# Patient Record
Sex: Female | Born: 1985 | Race: Black or African American | Hispanic: No | Marital: Single | Smoking: Former smoker
Health system: Southern US, Community
[De-identification: ages and names within clinical notes are randomized; demographics above are authoritative.]

## PROBLEM LIST (undated history)

## (undated) ENCOUNTER — Inpatient Hospital Stay (HOSPITAL_COMMUNITY): Payer: Self-pay

## (undated) DIAGNOSIS — G43909 Migraine, unspecified, not intractable, without status migrainosus: Secondary | ICD-10-CM

## (undated) DIAGNOSIS — R87629 Unspecified abnormal cytological findings in specimens from vagina: Secondary | ICD-10-CM

---
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
## 2007-09-18 ENCOUNTER — Ambulatory Visit (HOSPITAL_COMMUNITY): Admission: AD | Admit: 2007-09-18 | Discharge: 2007-09-18 | Payer: Self-pay | Admitting: Obstetrics and Gynecology

## 2007-09-18 ENCOUNTER — Encounter (INDEPENDENT_AMBULATORY_CARE_PROVIDER_SITE_OTHER): Payer: Self-pay | Admitting: Obstetrics and Gynecology

## 2009-07-01 ENCOUNTER — Emergency Department (HOSPITAL_COMMUNITY): Admission: EM | Admit: 2009-07-01 | Discharge: 2009-07-01 | Payer: Self-pay | Admitting: Emergency Medicine

## 2010-04-18 ENCOUNTER — Inpatient Hospital Stay (HOSPITAL_COMMUNITY): Admission: AD | Admit: 2010-04-18 | Discharge: 2010-04-18 | Payer: Self-pay | Admitting: Obstetrics and Gynecology

## 2010-04-18 ENCOUNTER — Ambulatory Visit: Payer: Self-pay | Admitting: Gynecology

## 2010-05-09 ENCOUNTER — Inpatient Hospital Stay (HOSPITAL_COMMUNITY): Admission: AD | Admit: 2010-05-09 | Discharge: 2010-05-09 | Payer: Self-pay | Admitting: Obstetrics and Gynecology

## 2010-05-12 ENCOUNTER — Inpatient Hospital Stay (HOSPITAL_COMMUNITY): Admission: AD | Admit: 2010-05-12 | Discharge: 2010-05-15 | Payer: Self-pay | Admitting: Obstetrics and Gynecology

## 2010-08-04 ENCOUNTER — Emergency Department (HOSPITAL_BASED_OUTPATIENT_CLINIC_OR_DEPARTMENT_OTHER)
Admission: EM | Admit: 2010-08-04 | Discharge: 2010-08-04 | Disposition: A | Discharge status: Home / Self Care | Payer: 59 | Attending: Emergency Medicine | Admitting: Emergency Medicine

## 2010-08-04 DIAGNOSIS — F17200 Nicotine dependence, unspecified, uncomplicated: Secondary | ICD-10-CM | POA: Insufficient documentation

## 2010-08-04 DIAGNOSIS — R109 Unspecified abdominal pain: Secondary | ICD-10-CM | POA: Insufficient documentation

## 2010-08-04 DIAGNOSIS — N390 Urinary tract infection, site not specified: Secondary | ICD-10-CM | POA: Insufficient documentation

## 2010-08-04 LAB — URINALYSIS, ROUTINE W REFLEX MICROSCOPIC
Bilirubin Urine: NEGATIVE
Nitrite: NEGATIVE
Specific Gravity, Urine: 1.011 (ref 1.005–1.030)
pH: 6.5 (ref 5.0–8.0)

## 2010-08-04 LAB — URINE MICROSCOPIC-ADD ON

## 2010-08-05 ENCOUNTER — Emergency Department (HOSPITAL_BASED_OUTPATIENT_CLINIC_OR_DEPARTMENT_OTHER)
Admission: EM | Admit: 2010-08-05 | Discharge: 2010-08-05 | Disposition: A | Discharge status: Home / Self Care | Payer: 59 | Attending: Emergency Medicine | Admitting: Emergency Medicine

## 2010-08-05 ENCOUNTER — Emergency Department (INDEPENDENT_AMBULATORY_CARE_PROVIDER_SITE_OTHER): Payer: 59

## 2010-08-05 DIAGNOSIS — B9689 Other specified bacterial agents as the cause of diseases classified elsewhere: Secondary | ICD-10-CM | POA: Insufficient documentation

## 2010-08-05 DIAGNOSIS — N949 Unspecified condition associated with female genital organs and menstrual cycle: Secondary | ICD-10-CM

## 2010-08-05 DIAGNOSIS — N12 Tubulo-interstitial nephritis, not specified as acute or chronic: Secondary | ICD-10-CM | POA: Insufficient documentation

## 2010-08-05 DIAGNOSIS — A499 Bacterial infection, unspecified: Secondary | ICD-10-CM | POA: Insufficient documentation

## 2010-08-05 DIAGNOSIS — N760 Acute vaginitis: Secondary | ICD-10-CM | POA: Insufficient documentation

## 2010-08-05 LAB — COMPREHENSIVE METABOLIC PANEL
Albumin: 4.4 g/dL (ref 3.5–5.2)
Alkaline Phosphatase: 102 U/L (ref 39–117)
BUN: 7 mg/dL (ref 6–23)
Calcium: 9.3 mg/dL (ref 8.4–10.5)
Potassium: 3.6 mEq/L (ref 3.5–5.1)
Total Protein: 8.1 g/dL (ref 6.0–8.3)

## 2010-08-05 LAB — URINALYSIS, ROUTINE W REFLEX MICROSCOPIC
Ketones, ur: 40 mg/dL — AB
Urine Glucose, Fasting: NEGATIVE mg/dL
pH: 5 (ref 5.0–8.0)

## 2010-08-05 LAB — URINE MICROSCOPIC-ADD ON

## 2010-08-05 LAB — WET PREP, GENITAL
Trich, Wet Prep: NONE SEEN
Yeast Wet Prep HPF POC: NONE SEEN

## 2010-08-05 LAB — URINE CULTURE: Culture  Setup Time: 201202080536

## 2010-08-05 LAB — CBC
MCHC: 34.3 g/dL (ref 30.0–36.0)
RDW: 12.8 % (ref 11.5–15.5)

## 2010-08-05 LAB — DIFFERENTIAL
Basophils Absolute: 0 10*3/uL (ref 0.0–0.1)
Basophils Relative: 0 % (ref 0–1)
Eosinophils Absolute: 0 10*3/uL (ref 0.0–0.7)
Eosinophils Relative: 0 % (ref 0–5)
Monocytes Absolute: 1 10*3/uL (ref 0.1–1.0)

## 2010-08-06 LAB — GC/CHLAMYDIA PROBE AMP, GENITAL
Chlamydia, DNA Probe: NEGATIVE
GC Probe Amp, Genital: NEGATIVE

## 2010-08-17 ENCOUNTER — Other Ambulatory Visit: Payer: Self-pay | Admitting: Obstetrics and Gynecology

## 2010-09-07 LAB — CBC
HCT: 30.7 % — ABNORMAL LOW (ref 36.0–46.0)
Hemoglobin: 10.3 g/dL — ABNORMAL LOW (ref 12.0–15.0)
Hemoglobin: 10.6 g/dL — ABNORMAL LOW (ref 12.0–15.0)
RBC: 3.3 MIL/uL — ABNORMAL LOW (ref 3.87–5.11)
RBC: 3.4 MIL/uL — ABNORMAL LOW (ref 3.87–5.11)
WBC: 11.1 10*3/uL — ABNORMAL HIGH (ref 4.0–10.5)
WBC: 15.6 10*3/uL — ABNORMAL HIGH (ref 4.0–10.5)

## 2010-09-07 LAB — RPR: RPR Ser Ql: NONREACTIVE

## 2010-09-08 LAB — URINE MICROSCOPIC-ADD ON

## 2010-09-08 LAB — URINE CULTURE
Colony Count: NO GROWTH
Culture  Setup Time: 201110231846
Culture: NO GROWTH

## 2010-09-08 LAB — URINALYSIS, ROUTINE W REFLEX MICROSCOPIC
Glucose, UA: NEGATIVE mg/dL
Specific Gravity, Urine: 1.025 (ref 1.005–1.030)

## 2010-09-12 LAB — BASIC METABOLIC PANEL
BUN: 11 mg/dL (ref 6–23)
Chloride: 105 mEq/L (ref 96–112)
Creatinine, Ser: 0.88 mg/dL (ref 0.4–1.2)
GFR calc Af Amer: >60 mL/min (ref >60)
GFR calc non Af Amer: >60 mL/min (ref >60)

## 2010-09-12 LAB — CBC
MCV: 89.9 fL (ref 78.0–100.0)
Platelets: 302 10*3/uL (ref 150–400)
RBC: 3.85 MIL/uL — ABNORMAL LOW (ref 3.87–5.11)
WBC: 7.4 10*3/uL (ref 4.0–10.5)

## 2010-09-12 LAB — DIFFERENTIAL
Eosinophils Absolute: 0.2 10*3/uL (ref 0.0–0.7)
Lymphocytes Relative: 29 % (ref 12–46)
Lymphs Abs: 2.2 10*3/uL (ref 0.7–4.0)
Monocytes Relative: 6 % (ref 3–12)
Neutro Abs: 4.6 10*3/uL (ref 1.7–7.7)
Neutrophils Relative %: 62 % (ref 43–77)
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
## 2010-11-09 NOTE — Op Note (Signed)
NAMEMONTEZ, STRYKER           ACCOUNT NO.:  [id number]   MEDICAL RECORD NO.:  [id number]          PATIENT TYPE:  AMB   LOCATION:                                [facility]ILITY:  [facility]   PHYSICIAN:  Michelle L. Grewal, M.D.DATE OF BIRTH:  1986/05/04   DATE OF PROCEDURE:  09/18/2007  DATE OF DISCHARGE:                               OPERATIVE REPORT   PREOPERATIVE DIAGNOSIS:  Incomplete abortion.   POSTOPERATIVE DIAGNOSIS:  Incomplete abortion.   PROCEDURE:  D&E.   SURGEON:  Dr. Vincente Poli.   ANESTHESIA:  MAC with local.   SPECIMENS:  Products of conception.   ESTIMATED BLOOD LOSS:  Was minimal.   PROCEDURE:  Patient was taken to the operating room.  Her local was  infiltrated without difficulty after she was given IV sedation and  placed in the lithotomy position.  She was prepped and draped.  She was  noted to have a vagina full of clots.  The speculum was inserted into  the vagina.  The cervix was grasped with the tenaculum.  The  paracervical block was performed in standard fashion.  The cervix was  already dilated.  I did dilate a little bit more using Pratt dilators  without difficulty.  A #7 suction cannula was inserted, and the uterus  was thoroughly suctioned of all tissue.  It was consistent with products  of conception.  The suction cannula was removed, and sharp curette was  inserted.  The uterus was thoroughly curetted of all tissue.  Polyp  forceps were inserted, and the remainder of tissue was removed.  At the  end of the procedure, the uterine cavity was completely clean.  She was  given some Methergine because she had so much bleeding prior to going to  the operating room.  The patient went to recovery room in stable  condition.  All sponge, lap, and instrument counts were correct x2.  She  will be given RhoGAM if she is Rh negative.  She will also be sent home  with the Methergine and Vicodin to use.  She will be given the usual  postoperative precautions.  She will  follow up in Dr. Dennie Bible office  for postoperative check in two weeks.      Michelle L. Vincente Poli, M.D.  Electronically Signed     MLG/MEDQ  D:  09/18/2007  T:  09/19/2007  Job:  [id number]

## 2010-11-12 NOTE — H&P (Signed)
[name], [name] NO.:  [id number]   MEDICAL RECORD NO.:  [id number]           PATIENT TYPE:   LOCATION:  SDC                           FACILITY:  [facility]   PHYSICIAN:  Duke Salvia. Marcelle Overlie, M.D.DATE OF BIRTH:  Jan 01, 1986   DATE OF ADMISSION:  09/20/2007  DATE OF DISCHARGE:                              HISTORY & PHYSICAL   CHIEF COMPLAINT:  Missed AB.   HISTORY OF PRESENT ILLNESS:  A 25 year old, G2, P0-0-1-0 was seen in the  office for A new OB appointment, unable to hear FHR. Ultrasound was done  showing a 7-week 6-day IUP, no fetal heart activity noted. Presents now  for D&E this procedure including risks related to bleeding, infection,  other complications that may require additional surgery all reviewed  with her.  Her blood type is O+.   PAST MEDICAL HISTORY:   ALLERGIES:  None.   OPERATIONS:  None.   FAMILY HISTORY:  Significant for a father with hypertension, otherwise  unremarkable.   PHYSICAL EXAM:  VITAL SIGNS:  Temperature 98.2, blood pressure 120/78.  HEENT:  Unremarkable.  NECK:  Supple without masses.  LUNGS:  Clear.  CARDIOVASCULAR:  Regular rate and rhythm without murmurs, rubs, gallops.  BREASTS:  Without masses.  ABDOMEN:  Soft, flat, nontender.  PELVIC:  Normal external genitalia.  Vagina and cervix clear. The Uterus  is 7-8 weeks size, mid position.  Adnexa negative.  EXTREMITIES/NEUROLOGIC:  Unremarkable.   IMPRESSION:  7-8 week intrauterine pregnancy, missed abortion.   PLAN:  D&E procedure and risks reviewed as above up.      Richard M. Marcelle Overlie, M.D.  Electronically Signed     RMH/MEDQ  D:  09/20/2007  T:  09/20/2007  Job:  [id number]

## 2010-11-24 ENCOUNTER — Emergency Department (INDEPENDENT_AMBULATORY_CARE_PROVIDER_SITE_OTHER): Payer: No Typology Code available for payment source

## 2010-11-24 ENCOUNTER — Emergency Department (HOSPITAL_BASED_OUTPATIENT_CLINIC_OR_DEPARTMENT_OTHER)
Admission: EM | Admit: 2010-11-24 | Discharge: 2010-11-25 | Disposition: A | Discharge status: Home / Self Care | Payer: No Typology Code available for payment source | Attending: Emergency Medicine | Admitting: Emergency Medicine

## 2010-11-24 DIAGNOSIS — Y92410 Unspecified street and highway as the place of occurrence of the external cause: Secondary | ICD-10-CM | POA: Insufficient documentation

## 2010-11-24 DIAGNOSIS — F17200 Nicotine dependence, unspecified, uncomplicated: Secondary | ICD-10-CM | POA: Insufficient documentation

## 2010-11-24 DIAGNOSIS — S139XXA Sprain of joints and ligaments of unspecified parts of neck, initial encounter: Secondary | ICD-10-CM | POA: Insufficient documentation

## 2010-11-24 DIAGNOSIS — M542 Cervicalgia: Secondary | ICD-10-CM

## 2011-01-01 ENCOUNTER — Emergency Department (INDEPENDENT_AMBULATORY_CARE_PROVIDER_SITE_OTHER): Payer: 59

## 2011-01-01 ENCOUNTER — Encounter: Payer: Self-pay | Admitting: *Deleted

## 2011-01-01 ENCOUNTER — Emergency Department (HOSPITAL_BASED_OUTPATIENT_CLINIC_OR_DEPARTMENT_OTHER)
Admission: EM | Admit: 2011-01-01 | Discharge: 2011-01-01 | Disposition: A | Discharge status: Home / Self Care | Payer: 59 | Attending: Emergency Medicine | Admitting: Emergency Medicine

## 2011-01-01 DIAGNOSIS — R05 Cough: Secondary | ICD-10-CM | POA: Insufficient documentation

## 2011-01-01 DIAGNOSIS — J019 Acute sinusitis, unspecified: Secondary | ICD-10-CM

## 2011-01-01 DIAGNOSIS — R070 Pain in throat: Secondary | ICD-10-CM

## 2011-01-01 DIAGNOSIS — R509 Fever, unspecified: Secondary | ICD-10-CM

## 2011-01-01 DIAGNOSIS — R059 Cough, unspecified: Secondary | ICD-10-CM

## 2011-01-01 DIAGNOSIS — R0989 Other specified symptoms and signs involving the circulatory and respiratory systems: Secondary | ICD-10-CM

## 2011-01-01 MED ORDER — IBUPROFEN 400 MG PO TABS
ORAL_TABLET | ORAL | Status: AC
  Filled 2011-01-01: qty 1

## 2011-01-01 MED ORDER — IBUPROFEN 400 MG PO TABS: 400.0000 mg | ORAL_TABLET | Freq: Once | ORAL | Status: DC

## 2011-01-01 MED ORDER — IBUPROFEN 200 MG PO TABS
ORAL_TABLET | ORAL | Status: AC
  Filled 2011-01-01: qty 1

## 2011-01-01 MED ORDER — IBUPROFEN 200 MG PO TABS: 200.0000 mg | ORAL_TABLET | Freq: Once | ORAL | Status: DC

## 2011-01-01 MED ORDER — HYDROCOD POLST-CHLORPHEN POLST 10-8 MG/5ML PO LQCR: 5.0000 mL | Freq: Two times a day (BID) | ORAL | Status: DC

## 2011-01-01 MED ORDER — ACETAMINOPHEN 325 MG PO TABS
650.0000 mg | ORAL_TABLET | Freq: Once | ORAL | Status: AC
  Administered 2011-01-01: 650 mg via ORAL

## 2011-01-01 MED ORDER — HYDROCOD POLST-CHLORPHEN POLST 10-8 MG/5ML PO LQCR
ORAL | Status: AC
  Filled 2011-01-01: qty 5

## 2011-01-01 MED ORDER — ACETAMINOPHEN 325 MG PO TABS
ORAL_TABLET | ORAL | Status: AC
  Filled 2011-01-01: qty 2

## 2011-01-01 MED ORDER — AZITHROMYCIN 250 MG PO TABS: ORAL_TABLET | ORAL | Status: DC

## 2011-01-01 MED ORDER — IBUPROFEN 600 MG PO TABS: 600.0000 mg | ORAL_TABLET | Freq: Once | ORAL | Status: DC

## 2011-01-01 MED ORDER — HYDROCOD POLST-CHLORPHEN POLST 10-8 MG/5ML PO LQCR
5.0000 mL | Freq: Once | ORAL | Status: AC
  Administered 2011-01-01: 5 mL via ORAL

## 2011-01-01 NOTE — Discharge Instructions (Signed)
Sinusitis Sinuses are air pockets within the bones of your face. The growth of bacteria within a sinus leads to infection. Infection keeps the sinuses from draining. This infection is called sinusitis. SYMPTOMS There will be different areas of pain depending on which sinuses have become infected.  The maxillary sinuses often produce pain beneath the eyes.   Frontal sinusitis may cause pain in the middle of the forehead and above the eyes.  Other problems (symptoms) include:  Toothaches.   Colored, pus-like (purulent) drainage from the nose.   Any swelling, warmth, or tenderness over the sinus areas may be signs of infection.  TREATMENT Sinusitis is most often determined by an exam and you may have x-rays taken. If x-rays have been taken, make sure you obtain your results. Or find out how you are to obtain them. Your caregiver may give you medications (antibiotics). These are medications that will help kill the infection. You may also be given a medication (decongestant) that helps to reduce sinus swelling.  HOME CARE INSTRUCTIONS  Only take over-the-counter or prescription medicines for pain, discomfort, or fever as directed by your caregiver.   Drink extra fluids. Fluids help thin the mucus so your sinuses can drain more easily.   Applying either moist heat or ice packs to the sinus areas may help relieve discomfort.   Use saline nasal sprays to help moisten your sinuses. The sprays can be found at your local drugstore.  SEEK IMMEDIATE MEDICAL CARE IF YOU DEVELOP:  High fever that is still present after two days of antibiotic treatment.   Increasing pain, severe headaches, or toothache.   Nausea, vomiting, or drowsiness.   Unusual swelling around the face or trouble seeing.  MAKE SURE YOU:   Understand these instructions.   Will watch your condition.   Will get help right away if you are not doing well or get worse.  Document Released: 06/13/2005 Document Re-Released:  05/26/2008 [employer] Patient Information 2011 [employer]. 

## 2011-01-01 NOTE — ED Notes (Signed)
Woke up with a fever 3 days ago.  Temps as high as 103.  Now has congested cough with productive yellow secretions.

## 2011-01-01 NOTE — ED Provider Notes (Addendum)
History     Chief Complaint  Patient presents with  . Fever    Temperature for 3 days, with productive cough w/ yellow secretons.   HPI Comments: Also nasal congestion some nasal and mid facial pain.  Took dayquil with no relief, tried Nyquil which made her feel much worse so stopped taking.  No obv sick contacts  Patient is a 25 y.o. female presenting with fever. The history is provided by the patient.  Fever Primary symptoms of the febrile illness include fever, fatigue, cough and myalgias. Primary symptoms do not include headaches, shortness of breath, nausea, vomiting, diarrhea, dysuria or rash. The current episode started 3 to 5 days ago. This is a new problem. The problem has not changed since onset.   History reviewed. No pertinent past medical history.  Past Surgical History  Procedure Date  . Facial fracture surgery 12/04/2006    titantan plate in right frontal area from [facility]    Family History  Problem Relation Age of Onset  . Cancer Mother   . Diabetes Other   . Hyperlipidemia Other   . Hypertension Other     History  Substance Use Topics  . Smoking status: Current Some Day Smoker -- 0.5 packs/day for 5 years    Types: Cigars  . Smokeless tobacco: Not on file  . Alcohol Use: No    OB History    Grav Para Term Preterm Abortions TAB SAB Ect Mult Living                  Review of Systems  Constitutional: Positive for fever and fatigue.  Respiratory: Positive for cough. Negative for shortness of breath.   Gastrointestinal: Negative for nausea, vomiting and diarrhea.  Genitourinary: Negative for dysuria.  Musculoskeletal: Positive for myalgias.  Skin: Negative for rash.  Neurological: Negative for headaches.  All other systems reviewed and are negative.    Physical Exam  BP 109/68  Pulse 87  Temp(Src) 101.8 F (38.8 C) (Oral)  Resp 18  Ht 5\' 7"  (1.702 m)  Wt 153 lb (69.4 kg)  BMI 23.96 kg/m2  SpO2 100%  LMP 01/01/2011  Physical Exam    Constitutional: She is oriented to person, place, and time. She appears well-developed and well-nourished.  HENT:  Head: Normocephalic and atraumatic.  Right Ear: External ear normal.  Left Ear: External ear normal.  Nose: Mucosal edema present. No rhinorrhea. Right sinus exhibits maxillary sinus tenderness. Right sinus exhibits no frontal sinus tenderness. Left sinus exhibits maxillary sinus tenderness. Left sinus exhibits no frontal sinus tenderness.  Mouth/Throat: Oropharynx is clear and moist. No oropharyngeal exudate.  Neck: Neck supple.  Cardiovascular: Normal rate, regular rhythm and normal heart sounds.   Pulmonary/Chest: Effort normal. No accessory muscle usage. No respiratory distress. She has no decreased breath sounds. She has no wheezes. She has no rales.  Abdominal: Soft. There is no tenderness.  Musculoskeletal: Normal range of motion. She exhibits no tenderness.  Lymphadenopathy:    She has no cervical adenopathy.  Neurological: She is alert and oriented to person, place, and time.  Skin: Skin is warm and dry. No rash noted.    ED Course  Procedures  MDM Pt with likely sinus infection asuign post nasal drip and cough.  Pt with myalgias secondary to fever and infection.  sats are normal.  Will give tussionex for cough and pain, will likely treat with abx for sinusitis.  CXR to assess for mass or obv infiltrate.     chest  X-ray shows no acute per radiologist, reviewed by myself.      Gavin Pound. Oletta Lamas, MD 01/01/11 1220  Gavin Pound. Franky Reier, MD 01/01/11 1239

## 2011-03-21 LAB — CBC
HCT: 33.3 — ABNORMAL LOW
Hemoglobin: 11.2 — ABNORMAL LOW
MCHC: 33.7
MCV: 86.2
RBC: 3.86 — ABNORMAL LOW
WBC: 11.1 — ABNORMAL HIGH

## 2011-03-21 LAB — ABO/RH: ABO/RH(D): O POS

## 2011-04-14 LAB — URINE MICROSCOPIC-ADD ON

## 2011-04-14 LAB — URINE CULTURE
Colony Count: NO GROWTH
Culture: NO GROWTH

## 2011-04-14 LAB — RAPID URINE DRUG SCREEN, HOSP PERFORMED
Amphetamines: NOT DETECTED
Barbiturates: NOT DETECTED
Benzodiazepines: NOT DETECTED
Cocaine: NOT DETECTED
Opiates: NOT DETECTED
Tetrahydrocannabinol: POSITIVE — AB

## 2011-04-14 LAB — I-STAT 8, (EC8 V) (CONVERTED LAB)
Acid-base deficit: 4 — ABNORMAL HIGH
BUN: 5 — ABNORMAL LOW
Chloride: 108
HCT: 36
Operator id: 277751
Potassium: 3.4 — ABNORMAL LOW

## 2011-04-14 LAB — URINALYSIS, ROUTINE W REFLEX MICROSCOPIC
Bilirubin Urine: NEGATIVE
Glucose, UA: NEGATIVE
Ketones, ur: NEGATIVE
Nitrite: NEGATIVE
Protein, ur: NEGATIVE
Specific Gravity, Urine: 1.02
Urobilinogen, UA: 0.2
pH: 5.5

## 2011-04-14 LAB — SAMPLE TO BLOOD BANK

## 2011-04-14 LAB — ETHANOL: Alcohol, Ethyl (B): <5

## 2011-04-14 LAB — POCT I-STAT CREATININE: Creatinine, Ser: 0.8

## 2011-04-14 LAB — PREGNANCY, URINE: Preg Test, Ur: NEGATIVE

## 2011-08-19 ENCOUNTER — Encounter (HOSPITAL_BASED_OUTPATIENT_CLINIC_OR_DEPARTMENT_OTHER): Payer: Self-pay

## 2011-08-19 ENCOUNTER — Emergency Department (HOSPITAL_BASED_OUTPATIENT_CLINIC_OR_DEPARTMENT_OTHER)
Admission: EM | Admit: 2011-08-19 | Discharge: 2011-08-19 | Disposition: A | Discharge status: Home Health | Payer: 59 | Attending: Emergency Medicine | Admitting: Emergency Medicine

## 2011-08-19 DIAGNOSIS — R059 Cough, unspecified: Secondary | ICD-10-CM | POA: Insufficient documentation

## 2011-08-19 DIAGNOSIS — R05 Cough: Secondary | ICD-10-CM | POA: Insufficient documentation

## 2011-08-19 DIAGNOSIS — J069 Acute upper respiratory infection, unspecified: Secondary | ICD-10-CM | POA: Insufficient documentation

## 2011-08-19 MED ORDER — OXYMETAZOLINE HCL 0.05 % NA SOLN
1.0000 | Freq: Once | NASAL | Status: AC
  Administered 2011-08-19: 1 via NASAL
  Filled 2011-08-19: qty 15

## 2011-08-19 NOTE — ED Provider Notes (Addendum)
History     CSN: [id number]  Arrival date & time 08/19/11  1251   First MD Initiated Contact with Patient 08/19/11 1314      Chief Complaint  Patient presents with  . Cough    (Consider location/radiation/quality/duration/timing/severity/associated sxs/prior treatment) Patient is a 26 y.o. female presenting with URI. The history is provided by the patient.  URI The primary symptoms include sore throat and cough. Primary symptoms do not include fever, ear pain, wheezing, abdominal pain, nausea or vomiting. The current episode started 3 to 5 days ago. This is a new problem. The problem has not changed since onset. The onset of the illness is associated with exposure to sick contacts. Symptoms associated with the illness include sinus pressure, congestion and rhinorrhea. The illness is not associated with chills or facial pain. Risk factors: no risk factors.    History reviewed. No pertinent past medical history.  Past Surgical History  Procedure Date  . Facial fracture surgery 12/04/2006    titantan plate in right frontal area from [facility]    Family History  Problem Relation Age of Onset  . Cancer Mother   . Diabetes Other   . Hyperlipidemia Other   . Hypertension Other     History  Substance Use Topics  . Smoking status: Current Some Day Smoker -- 0.5 packs/day for 5 years  . Smokeless tobacco: Not on file  . Alcohol Use: No    OB History    Grav Para Term Preterm Abortions TAB SAB Ect Mult Living                  Review of Systems  Constitutional: Negative for fever and chills.  HENT: Positive for congestion, sore throat, rhinorrhea and sinus pressure. Negative for ear pain.   Respiratory: Positive for cough. Negative for wheezing.   Gastrointestinal: Negative for nausea, vomiting and abdominal pain.  All other systems reviewed and are negative.    Allergies  Review of patient's allergies indicates no known allergies.  Home Medications   Current Outpatient Rx   Name Route Sig Dispense Refill  . AZITHROMYCIN 250 MG PO TABS  2 tablets by mouth on first day, then 1 tablet PO daily on days 2-5 6 tablet 0  . HYDROCOD POLST-CPM POLST ER 10-8 MG/5ML PO LQCR Oral Take 5 mLs by mouth every 12 (twelve) hours. 140 mL 0  . ETONOGESTREL 68 MG Gypsum IMPL Subcutaneous Inject into the skin.        BP 109/55  Pulse 78  Temp(Src) 98 F (36.7 C) (Oral)  Resp 18  Ht 5\' 7"  (1.702 m)  Wt 156 lb (70.761 kg)  BMI 24.43 kg/m2  SpO2 100%  LMP 07/26/2011  Physical Exam  Nursing note and vitals reviewed. Constitutional: She is oriented to person, place, and time. She appears well-developed and well-nourished. No distress.  HENT:  Head: Normocephalic and atraumatic.  Right Ear: Tympanic membrane and ear canal normal.  Left Ear: Tympanic membrane and ear canal normal.  Nose: Mucosal edema and rhinorrhea present.  Mouth/Throat: Mucous membranes are normal. Posterior oropharyngeal erythema present. No oropharyngeal exudate, posterior oropharyngeal edema or tonsillar abscesses.  Eyes: EOM are normal. Pupils are equal, round, and reactive to light.  Neck: Normal range of motion. Neck supple.  Cardiovascular: Normal rate, regular rhythm, normal heart sounds and intact distal pulses.  Exam reveals no friction rub.   No murmur heard. Pulmonary/Chest: Effort normal and breath sounds normal. She has no wheezes. She has no rales.  Abdominal: Soft. Bowel sounds are normal. She exhibits no distension. There is no tenderness. There is no rebound and no guarding.  Musculoskeletal: Normal range of motion. She exhibits no tenderness.       No edema  Lymphadenopathy:    She has no cervical adenopathy.  Neurological: She is alert and oriented to person, place, and time. No cranial nerve deficit.  Skin: Skin is warm and dry. No rash noted.  Psychiatric: She has a normal mood and affect. Her behavior is normal.    ED Course  Procedures (including critical care time)  Labs  Reviewed - No data to display No results found.   1. URI (upper respiratory infection)       MDM   Pt with symptoms consistent with viral URI.  Well appearing here.  No signs of breathing difficulty  No signs of pharyngitis, otitis or abnormal abdominal findings.   pt to return with any further problems.         Gwyneth Sprout, MD 08/19/11 1323  Gwyneth Sprout, MD 08/19/11 1324

## 2011-08-19 NOTE — ED Notes (Signed)
nonprod cough, sinus pressure x 5 days

## 2012-06-18 IMAGING — US US TRANSVAGINAL NON-OB
1 series · 14 of 25 positions shown · non-contrast
Comparison: None

CLINICAL DATA: 24-year-old female with pelvic pain, nausea and
vomiting.



[Series 1: us transvaginal non-ob · 0.22mm/px · 14 of 65 slices shown]
[im 1/65]
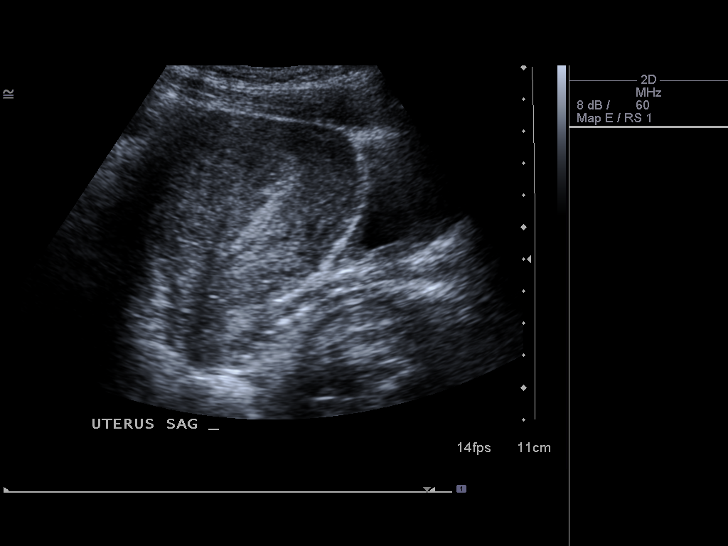
[im 6/65]
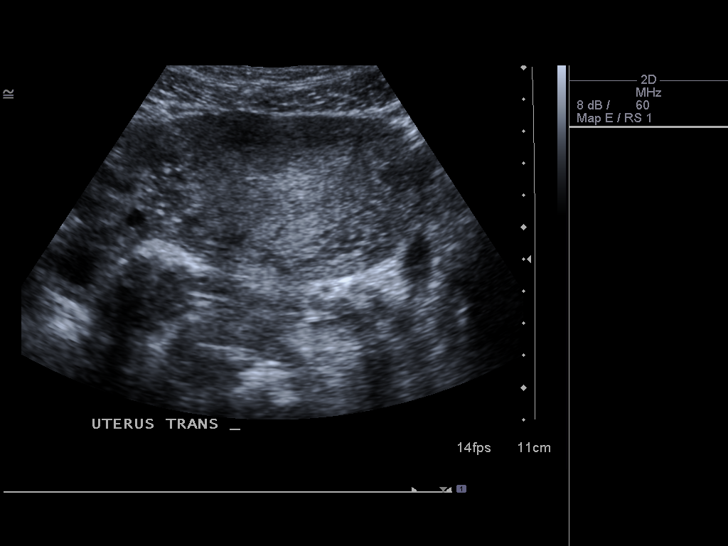
[im 11/65]
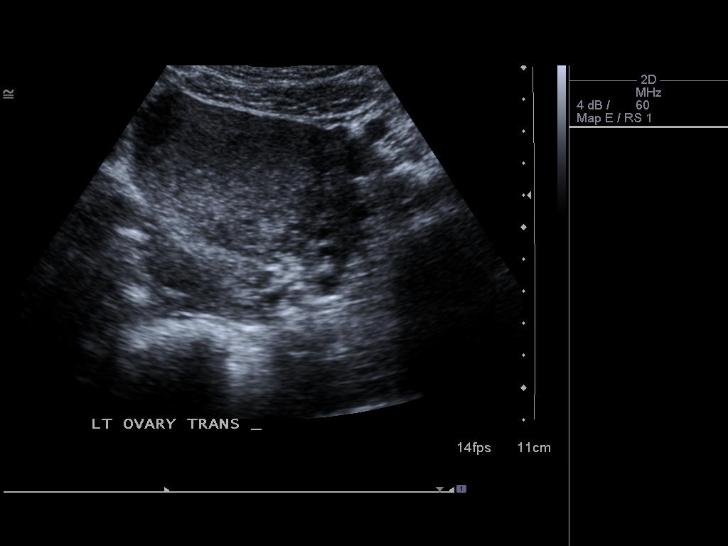
[im 17/65]
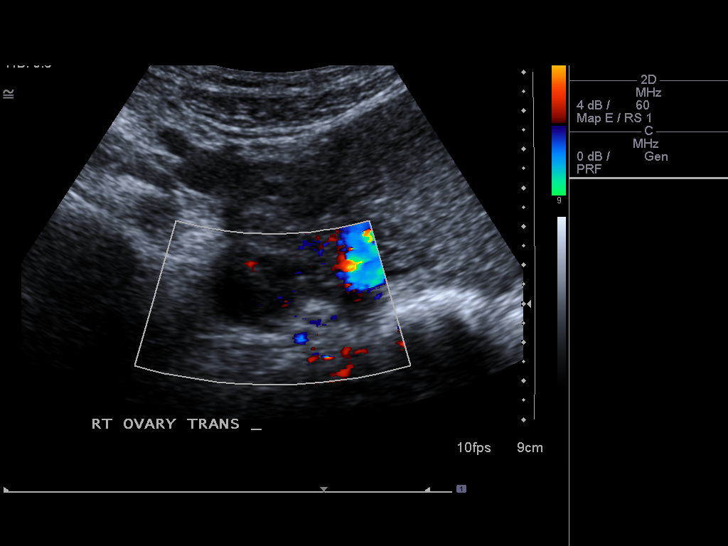
[im 22/65]
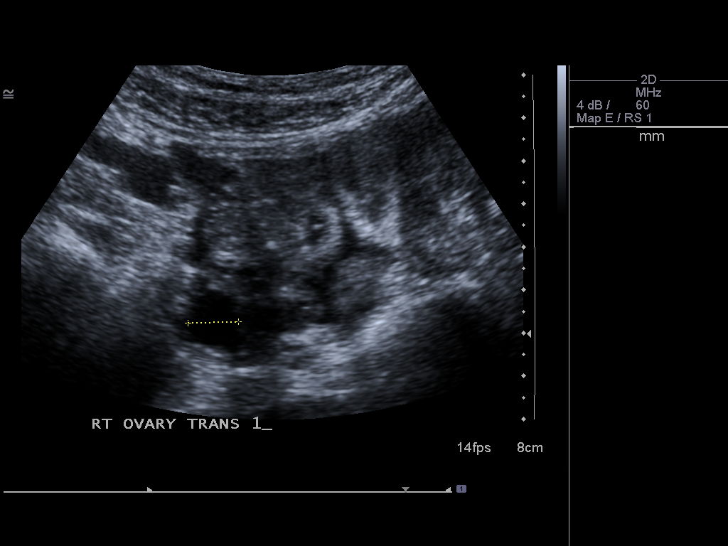
[im 25/65]
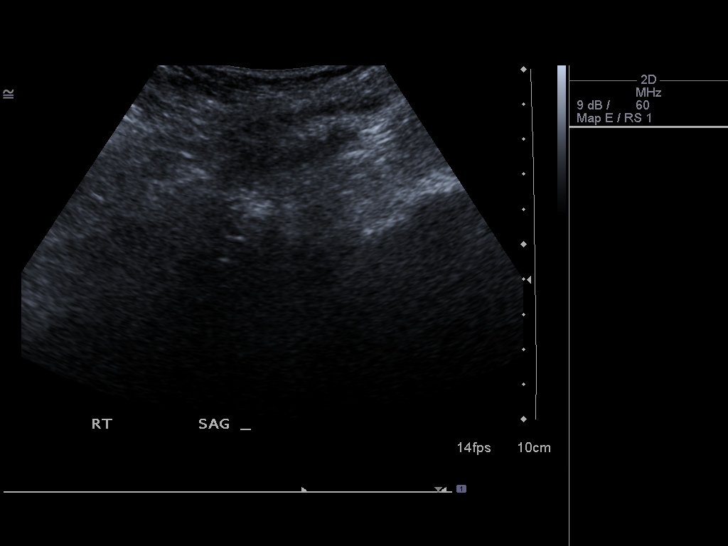
[im 30/65]
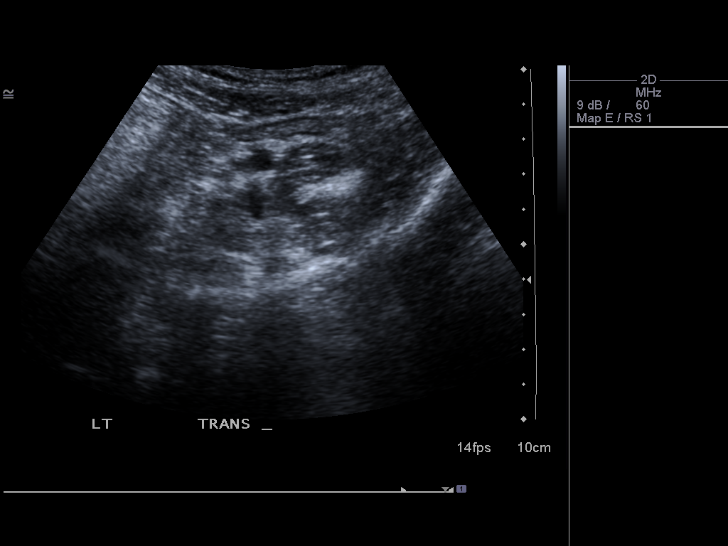
[im 35/65]
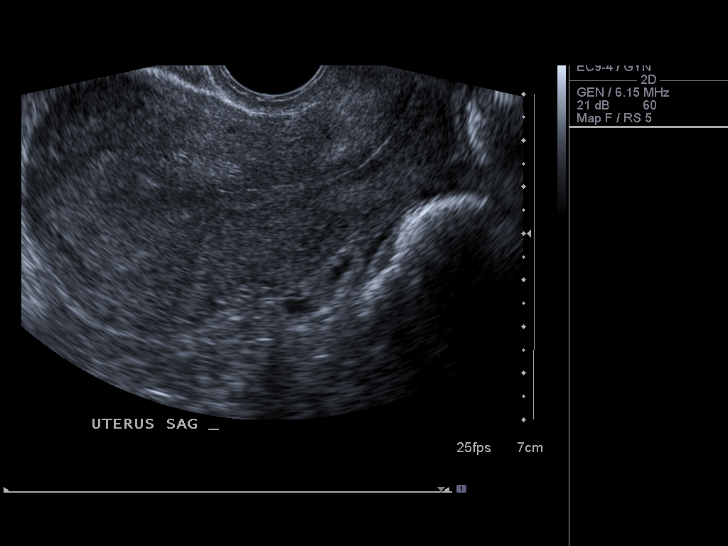
[im 41/65]
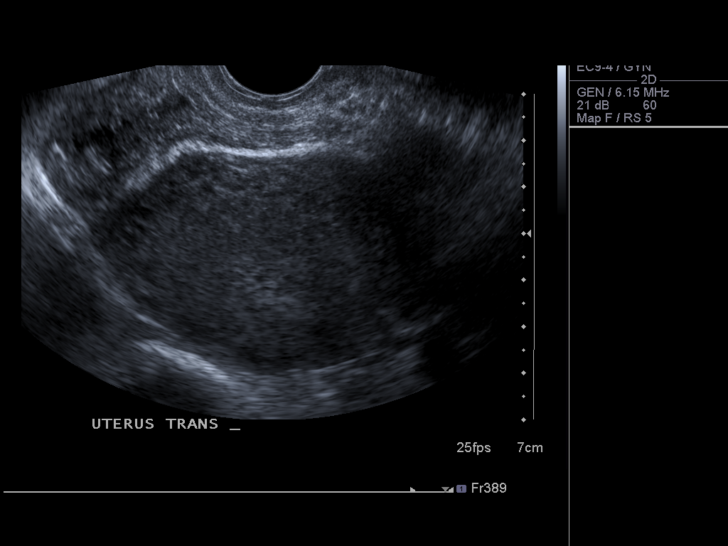
[im 43/65]
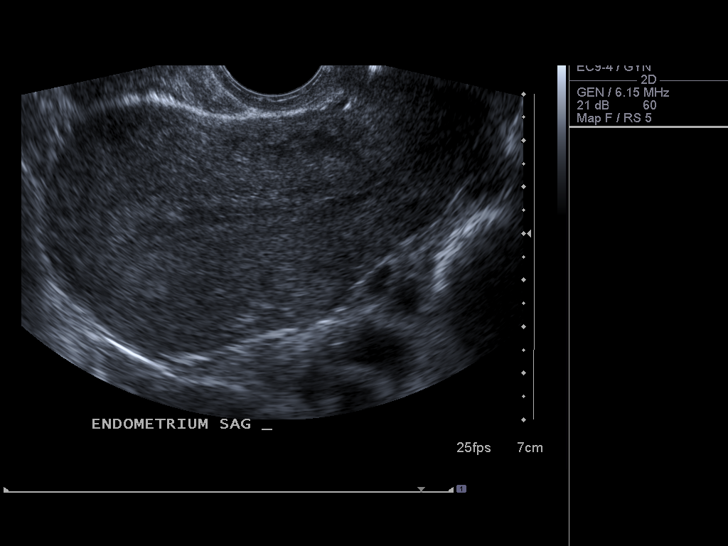
[im 49/65]
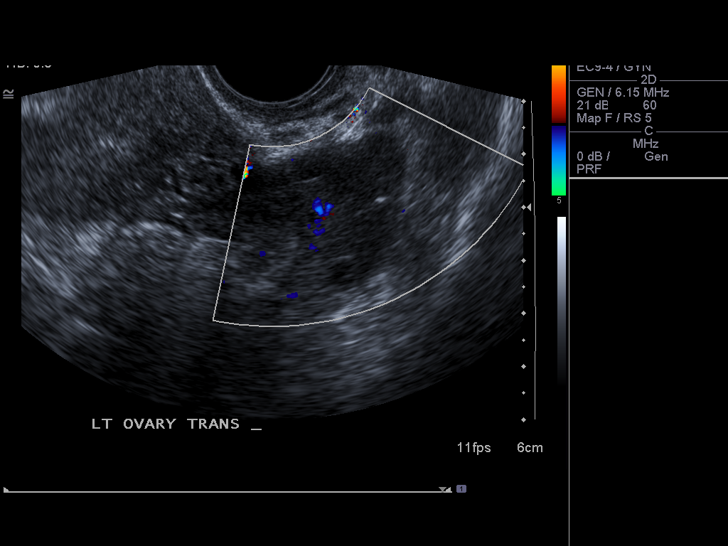
[im 54/65]
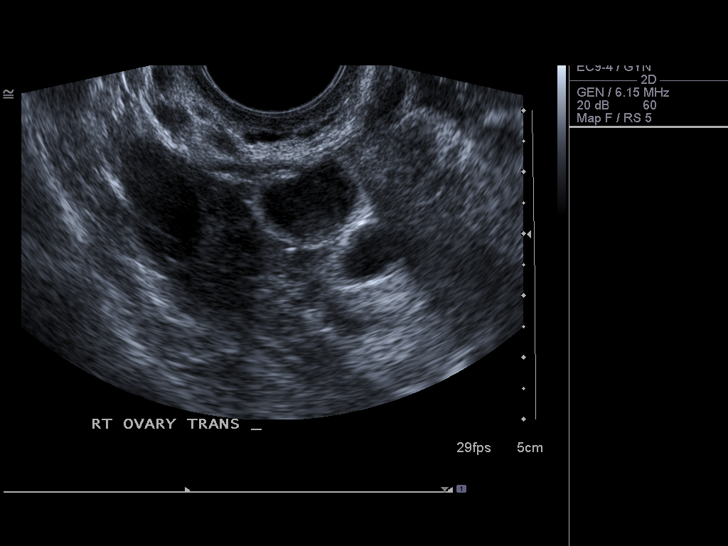
[im 59/65]
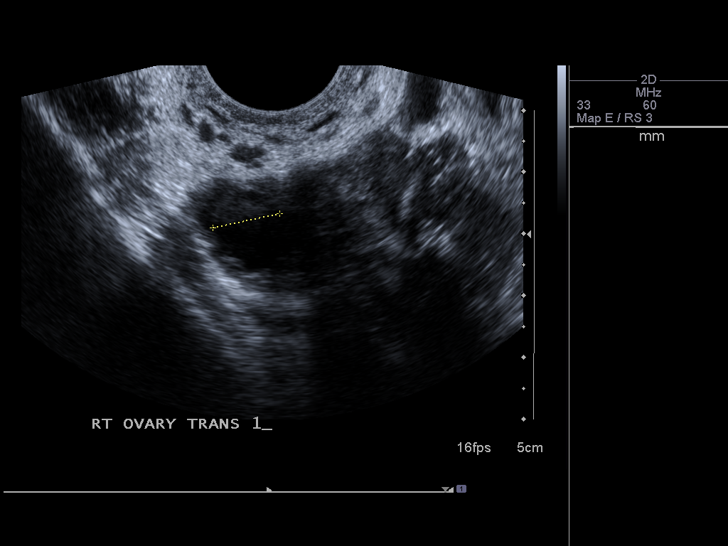
[im 65/65]
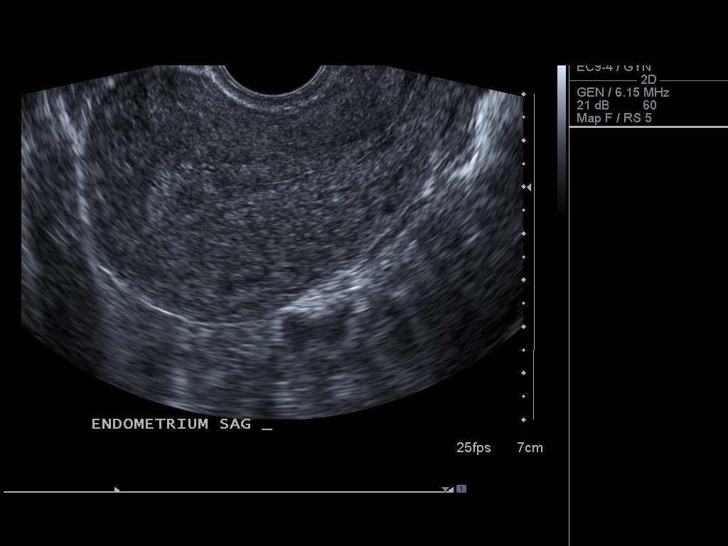

[14 of 25 positions shown; findings below may reference images not displayed]

FINDINGS: Uterus is unremarkable and anteverted measuring 9.3 x 5.7 x 7.2 cm.
No focal uterine lesions or masses are identified.

Endometrium is trilaminar measuring 12 mm.

Right Ovary measures 3.4 x 1.7 x 2.2 cm.  A 1.4 cm minimally
complicated cyst / follicle is identified.

Left Ovary is unremarkable measuring 2.9 x 2.4 x 2.3 cm.

Other Findings:  There is no evidence of adnexal mass or free
fluid.
IMPRESSION: Unremarkable pelvic ultrasound.

## 2012-08-19 ENCOUNTER — Encounter (HOSPITAL_BASED_OUTPATIENT_CLINIC_OR_DEPARTMENT_OTHER): Payer: Self-pay | Admitting: *Deleted

## 2012-08-19 ENCOUNTER — Emergency Department (HOSPITAL_BASED_OUTPATIENT_CLINIC_OR_DEPARTMENT_OTHER)
Admission: EM | Admit: 2012-08-19 | Discharge: 2012-08-19 | Disposition: A | Discharge status: Home / Self Care | Payer: 59 | Attending: Emergency Medicine | Admitting: Emergency Medicine

## 2012-08-19 DIAGNOSIS — B9789 Other viral agents as the cause of diseases classified elsewhere: Secondary | ICD-10-CM | POA: Insufficient documentation

## 2012-08-19 DIAGNOSIS — R197 Diarrhea, unspecified: Secondary | ICD-10-CM | POA: Insufficient documentation

## 2012-08-19 DIAGNOSIS — R1013 Epigastric pain: Secondary | ICD-10-CM | POA: Insufficient documentation

## 2012-08-19 DIAGNOSIS — F17200 Nicotine dependence, unspecified, uncomplicated: Secondary | ICD-10-CM | POA: Insufficient documentation

## 2012-08-19 DIAGNOSIS — Z3202 Encounter for pregnancy test, result negative: Secondary | ICD-10-CM | POA: Insufficient documentation

## 2012-08-19 LAB — URINALYSIS, ROUTINE W REFLEX MICROSCOPIC
Glucose, UA: NEGATIVE mg/dL
Ketones, ur: 15 mg/dL — AB
Leukocytes, UA: NEGATIVE
pH: 6 (ref 5.0–8.0)

## 2012-08-19 LAB — URINE MICROSCOPIC-ADD ON

## 2012-08-19 MED ORDER — ONDANSETRON 8 MG PO TBDP
8.0000 mg | ORAL_TABLET | Freq: Once | ORAL | Status: AC
  Administered 2012-08-19: 8 mg via ORAL
  Filled 2012-08-19: qty 1

## 2012-08-19 MED ORDER — ONDANSETRON 4 MG PO TBDP: 8.0000 mg | ORAL_TABLET | Freq: Three times a day (TID) | ORAL | Status: DC | PRN

## 2012-08-19 NOTE — ED Notes (Signed)
Abd pain, vomiting, diarrhea x 24 hrs

## 2012-08-19 NOTE — ED Provider Notes (Signed)
History/physical exam/procedure(s) were performed by non-physician practitioner and as supervising physician I was immediately available for consultation/collaboration. I have reviewed all notes and am in agreement with care and plan.   [name] S Alison Breeding, MD 08/19/12 2308 
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
## 2012-11-06 ENCOUNTER — Emergency Department (HOSPITAL_BASED_OUTPATIENT_CLINIC_OR_DEPARTMENT_OTHER)
Admission: EM | Admit: 2012-11-06 | Discharge: 2012-11-06 | Disposition: A | Discharge status: Home / Self Care | Payer: 59 | Attending: Emergency Medicine | Admitting: Emergency Medicine

## 2012-11-06 ENCOUNTER — Emergency Department (HOSPITAL_BASED_OUTPATIENT_CLINIC_OR_DEPARTMENT_OTHER): Payer: 59

## 2012-11-06 ENCOUNTER — Encounter (HOSPITAL_BASED_OUTPATIENT_CLINIC_OR_DEPARTMENT_OTHER): Payer: Self-pay | Admitting: *Deleted

## 2012-11-06 DIAGNOSIS — Z79899 Other long term (current) drug therapy: Secondary | ICD-10-CM | POA: Insufficient documentation

## 2012-11-06 DIAGNOSIS — Z87828 Personal history of other (healed) physical injury and trauma: Secondary | ICD-10-CM | POA: Insufficient documentation

## 2012-11-06 DIAGNOSIS — G43909 Migraine, unspecified, not intractable, without status migrainosus: Secondary | ICD-10-CM | POA: Insufficient documentation

## 2012-11-06 DIAGNOSIS — Z3202 Encounter for pregnancy test, result negative: Secondary | ICD-10-CM | POA: Insufficient documentation

## 2012-11-06 DIAGNOSIS — R55 Syncope and collapse: Secondary | ICD-10-CM | POA: Insufficient documentation

## 2012-11-06 DIAGNOSIS — R319 Hematuria, unspecified: Secondary | ICD-10-CM | POA: Insufficient documentation

## 2012-11-06 DIAGNOSIS — R51 Headache: Secondary | ICD-10-CM

## 2012-11-06 DIAGNOSIS — F17200 Nicotine dependence, unspecified, uncomplicated: Secondary | ICD-10-CM | POA: Insufficient documentation

## 2012-11-06 HISTORY — DX: Migraine, unspecified, not intractable, without status migrainosus: G43.909

## 2012-11-06 LAB — URINALYSIS, ROUTINE W REFLEX MICROSCOPIC
Protein, ur: 30 mg/dL — AB
Urobilinogen, UA: 1 mg/dL (ref 0.0–1.0)

## 2012-11-06 LAB — URINE MICROSCOPIC-ADD ON

## 2012-11-06 LAB — BASIC METABOLIC PANEL
BUN: 8 mg/dL (ref 6–23)
Creatinine, Ser: 0.7 mg/dL (ref 0.50–1.10)
GFR calc Af Amer: >90 mL/min (ref >90)
GFR calc non Af Amer: >90 mL/min (ref >90)

## 2012-11-06 LAB — CBC
HCT: 32.7 % — ABNORMAL LOW (ref 36.0–46.0)
MCHC: 34.6 g/dL (ref 30.0–36.0)
MCV: 86.1 fL (ref 78.0–100.0)
RDW: 13.2 % (ref 11.5–15.5)

## 2012-11-06 MED ORDER — DIPHENHYDRAMINE HCL 50 MG/ML IJ SOLN
25.0000 mg | Freq: Once | INTRAMUSCULAR | Status: AC
  Administered 2012-11-06: 25 mg via INTRAVENOUS
  Filled 2012-11-06: qty 1

## 2012-11-06 MED ORDER — SODIUM CHLORIDE 0.9 % IV BOLUS (SEPSIS)
1000.0000 mL | Freq: Once | INTRAVENOUS | Status: AC
  Administered 2012-11-06: 1000 mL via INTRAVENOUS

## 2012-11-06 MED ORDER — KETOROLAC TROMETHAMINE 30 MG/ML IJ SOLN
30.0000 mg | Freq: Once | INTRAMUSCULAR | Status: AC
  Administered 2012-11-06: 30 mg via INTRAVENOUS
  Filled 2012-11-06: qty 1

## 2012-11-06 MED ORDER — METOCLOPRAMIDE HCL 5 MG/ML IJ SOLN
10.0000 mg | Freq: Once | INTRAMUSCULAR | Status: AC
  Administered 2012-11-06: 10 mg via INTRAVENOUS
  Filled 2012-11-06: qty 2
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
## 2012-11-06 NOTE — ED Notes (Signed)
Migraine headache. States she passed out when she got out of the car after getting to work.

## 2012-11-08 NOTE — ED Provider Notes (Signed)
Medical screening examination/treatment/procedure(s) were performed by non-physician practitioner and as supervising physician I was immediately available for consultation/collaboration.  Ryka Beighley, MD 11/08/12 1338 

## 2013-01-01 ENCOUNTER — Emergency Department (HOSPITAL_BASED_OUTPATIENT_CLINIC_OR_DEPARTMENT_OTHER): Payer: 59

## 2013-01-01 ENCOUNTER — Encounter (HOSPITAL_BASED_OUTPATIENT_CLINIC_OR_DEPARTMENT_OTHER): Payer: Self-pay | Admitting: *Deleted

## 2013-01-01 ENCOUNTER — Emergency Department (HOSPITAL_BASED_OUTPATIENT_CLINIC_OR_DEPARTMENT_OTHER)
Admission: EM | Admit: 2013-01-01 | Discharge: 2013-01-01 | Disposition: A | Discharge status: Home / Self Care | Payer: 59 | Attending: Emergency Medicine | Admitting: Emergency Medicine

## 2013-01-01 DIAGNOSIS — Z792 Long term (current) use of antibiotics: Secondary | ICD-10-CM | POA: Insufficient documentation

## 2013-01-01 DIAGNOSIS — Z9889 Other specified postprocedural states: Secondary | ICD-10-CM | POA: Insufficient documentation

## 2013-01-01 DIAGNOSIS — F17200 Nicotine dependence, unspecified, uncomplicated: Secondary | ICD-10-CM | POA: Insufficient documentation

## 2013-01-01 DIAGNOSIS — Z87828 Personal history of other (healed) physical injury and trauma: Secondary | ICD-10-CM | POA: Insufficient documentation

## 2013-01-01 DIAGNOSIS — G43909 Migraine, unspecified, not intractable, without status migrainosus: Secondary | ICD-10-CM | POA: Insufficient documentation

## 2013-01-01 DIAGNOSIS — J3489 Other specified disorders of nose and nasal sinuses: Secondary | ICD-10-CM | POA: Insufficient documentation

## 2013-01-01 DIAGNOSIS — J329 Chronic sinusitis, unspecified: Secondary | ICD-10-CM | POA: Insufficient documentation

## 2013-01-01 DIAGNOSIS — H02849 Edema of unspecified eye, unspecified eyelid: Secondary | ICD-10-CM | POA: Insufficient documentation

## 2013-01-01 DIAGNOSIS — Z79899 Other long term (current) drug therapy: Secondary | ICD-10-CM | POA: Insufficient documentation

## 2013-01-01 MED ORDER — DM-GUAIFENESIN ER 30-600 MG PO TB12: 1.0000 | ORAL_TABLET | Freq: Two times a day (BID) | ORAL | Status: DC

## 2013-01-01 MED ORDER — AMOXICILLIN-POT CLAVULANATE 875-125 MG PO TABS: 1.0000 | ORAL_TABLET | Freq: Two times a day (BID) | ORAL | Status: DC
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
## 2013-06-27 NOTE — L&D Delivery Note (Signed)
SVD of VMI at 1242 on 03/24/14.  EBL 300cc.  APGARs 9,9.  Placenta to L&D. Head delivered LOA with loose nuchal x 1 reduced.  Body delivered atraumatically.  Mouth and nose bulb suctioned.  Cord was clamped, cut and baby to abdomen.  Cord blood was obtained.  Placenta delivered S/I/3VC.  Fundus firmed with pitocin and massage.  Left periurethral lac repaired with 3-0 Rapide in the normal fashion.  Mom and baby stable.   Mitchel Honour, DO

## 2013-09-02 LAB — OB RESULTS CONSOLE ABO/RH: RH TYPE: POSITIVE

## 2013-09-02 LAB — OB RESULTS CONSOLE ANTIBODY SCREEN: Antibody Screen: NEGATIVE

## 2013-09-02 LAB — OB RESULTS CONSOLE HIV ANTIBODY (ROUTINE TESTING): HIV: NONREACTIVE

## 2013-09-02 LAB — OB RESULTS CONSOLE HEPATITIS B SURFACE ANTIGEN: Hepatitis B Surface Ag: NEGATIVE

## 2013-09-02 LAB — OB RESULTS CONSOLE RUBELLA ANTIBODY, IGM: RUBELLA: IMMUNE

## 2013-09-02 LAB — OB RESULTS CONSOLE RPR: RPR: NONREACTIVE

## 2014-02-27 LAB — OB RESULTS CONSOLE GC/CHLAMYDIA
CHLAMYDIA, DNA PROBE: NEGATIVE
Gonorrhea: NEGATIVE

## 2014-02-27 LAB — OB RESULTS CONSOLE GBS: GBS: POSITIVE

## 2014-03-12 ENCOUNTER — Encounter (HOSPITAL_COMMUNITY): Payer: Self-pay

## 2014-03-12 ENCOUNTER — Inpatient Hospital Stay (HOSPITAL_COMMUNITY)
Admission: AD | Admit: 2014-03-12 | Discharge: 2014-03-12 | Disposition: A | Discharge status: Home / Self Care | Payer: Medicaid Other | Source: Ambulatory Visit | Attending: Obstetrics and Gynecology | Admitting: Obstetrics and Gynecology

## 2014-03-12 DIAGNOSIS — O479 False labor, unspecified: Secondary | ICD-10-CM | POA: Insufficient documentation

## 2014-03-12 HISTORY — DX: Unspecified abnormal cytological findings in specimens from vagina: R87.629

## 2014-03-12 NOTE — Discharge Instructions (Signed)
Keep your scheduled appointment for prenatal care. Drink 8-10 glasses of water per day. Make sure the baby is moving well everyday. Call MD office or provider on call with any concerns or return to MAU as needed.

## 2014-03-12 NOTE — MAU Note (Signed)
Contractions every 5 mins since 4am. Denies LOF or vag bleeding. +FM.

## 2014-03-14 ENCOUNTER — Inpatient Hospital Stay (HOSPITAL_COMMUNITY)
Admission: AD | Admit: 2014-03-14 | Discharge: 2014-03-15 | Disposition: A | Discharge status: Home / Self Care | Payer: Medicaid Other | Source: Ambulatory Visit | Attending: Obstetrics and Gynecology | Admitting: Obstetrics and Gynecology

## 2014-03-14 DIAGNOSIS — N898 Other specified noninflammatory disorders of vagina: Secondary | ICD-10-CM | POA: Insufficient documentation

## 2014-03-14 DIAGNOSIS — O288 Other abnormal findings on antenatal screening of mother: Secondary | ICD-10-CM

## 2014-03-14 DIAGNOSIS — O9989 Other specified diseases and conditions complicating pregnancy, childbirth and the puerperium: Secondary | ICD-10-CM

## 2014-03-14 DIAGNOSIS — O26893 Other specified pregnancy related conditions, third trimester: Secondary | ICD-10-CM

## 2014-03-14 DIAGNOSIS — O99891 Other specified diseases and conditions complicating pregnancy: Secondary | ICD-10-CM | POA: Insufficient documentation

## 2014-03-14 DIAGNOSIS — O368390 Maternal care for abnormalities of the fetal heart rate or rhythm, unspecified trimester, not applicable or unspecified: Secondary | ICD-10-CM | POA: Insufficient documentation

## 2014-03-14 NOTE — MAU Note (Signed)
Went to BR and noticed dark brown, red fld that leaked down leg. Alittle more leaked shortly thereafter but none since. Irregular contractions about 7-38mins apart

## 2014-03-15 ENCOUNTER — Encounter (HOSPITAL_COMMUNITY): Payer: Self-pay | Admitting: *Deleted

## 2014-03-15 ENCOUNTER — Inpatient Hospital Stay (HOSPITAL_COMMUNITY): Payer: Medicaid Other

## 2014-03-15 DIAGNOSIS — N898 Other specified noninflammatory disorders of vagina: Secondary | ICD-10-CM | POA: Diagnosis not present

## 2014-03-15 DIAGNOSIS — O479 False labor, unspecified: Secondary | ICD-10-CM | POA: Diagnosis present

## 2014-03-15 DIAGNOSIS — O368390 Maternal care for abnormalities of the fetal heart rate or rhythm, unspecified trimester, not applicable or unspecified: Secondary | ICD-10-CM | POA: Diagnosis not present

## 2014-03-15 DIAGNOSIS — O99891 Other specified diseases and conditions complicating pregnancy: Secondary | ICD-10-CM | POA: Diagnosis not present

## 2014-03-15 LAB — POCT FERN TEST: POCT FERN TEST: NEGATIVE

## 2014-03-15 NOTE — MAU Provider Note (Signed)
Subjective:.   Ms. [name] is a 28 y.o. female [phone] at [redacted]w[redacted]d who presents with contrations and possible ROM. The patient used the bathroom and stood up and felt a gush of fluid, with a small amount of clear fluid that leaked down to her knee. She then felt another small amount of fluid come out while she was walking around. She denies vaginal bleeding, +fetal movements.    Objective:  GENERAL: Well-developed, well-nourished female in no acute distress.  HEENT: Normocephalic, atraumatic.   LUNGS: Effort normal HEART: Regular rate  SKIN: Warm, dry and without erythema PSYCH: Normal mood and affect  Filed Vitals:   03/14/14 2344  BP: 131/78  Pulse: 90  Temp: 98.6 F (37 C)  Resp: 18    Speculum exam: Vagina - Small amount of creamy, tan colored discharge, no odor, no pooling of fluid in the vaginal canal  Cervix - No contact bleeding, no active bleeding  Bimanual exam: Dilation: 2 Effacement (%): 50 Station: -2 Presentation: Vertex Exam by:: J.Rasch,NP Chaperone present for exam.  Fetal Tracing: Baseline: 150 bpm  Variability: Moderate  Accelerations: 15x15 Decelerations: variable decelerations  Toco: occasional UI   MDM Non-reactive fetal tracing after 30 minutes of NST- BPP ordered.  BPP 8/8, AFI 12.1 cm  Discussed US findings with Dr. Marcelle Overlie. Ok to discharge the patient home.  Negative ferning   A:  1. Vaginal discharge during pregnancy, third trimester   2. Non-reactive NST (non-stress test)    P:  Discharge home in stable condition Kick counts  Labor precautions  Return to MAU if symptoms worsen   [name], NP 03/15/2014 12:25 AM

## 2014-03-21 ENCOUNTER — Telehealth (HOSPITAL_COMMUNITY): Payer: Self-pay | Admitting: *Deleted

## 2014-03-21 NOTE — Telephone Encounter (Signed)
Preadmission screen  

## 2014-03-24 ENCOUNTER — Inpatient Hospital Stay (HOSPITAL_COMMUNITY): Payer: Medicaid Other | Admitting: Anesthesiology

## 2014-03-24 ENCOUNTER — Inpatient Hospital Stay (HOSPITAL_COMMUNITY)
Admission: AD | Admit: 2014-03-24 | Discharge: 2014-03-26 | DRG: 775 | Disposition: A | Discharge status: Home / Self Care | Payer: Medicaid Other | Source: Ambulatory Visit | Attending: Obstetrics & Gynecology | Admitting: Obstetrics & Gynecology

## 2014-03-24 ENCOUNTER — Encounter (HOSPITAL_COMMUNITY): Payer: Medicaid Other | Admitting: Anesthesiology

## 2014-03-24 ENCOUNTER — Encounter (HOSPITAL_COMMUNITY): Payer: Self-pay

## 2014-03-24 DIAGNOSIS — Z833 Family history of diabetes mellitus: Secondary | ICD-10-CM | POA: Diagnosis not present

## 2014-03-24 DIAGNOSIS — O9902 Anemia complicating childbirth: Secondary | ICD-10-CM | POA: Diagnosis present

## 2014-03-24 DIAGNOSIS — O99892 Other specified diseases and conditions complicating childbirth: Secondary | ICD-10-CM | POA: Diagnosis present

## 2014-03-24 DIAGNOSIS — Z87891 Personal history of nicotine dependence: Secondary | ICD-10-CM

## 2014-03-24 DIAGNOSIS — Z22330 Carrier of Group B streptococcus: Secondary | ICD-10-CM

## 2014-03-24 DIAGNOSIS — D649 Anemia, unspecified: Secondary | ICD-10-CM | POA: Diagnosis present

## 2014-03-24 DIAGNOSIS — O4290 Premature rupture of membranes, unspecified as to length of time between rupture and onset of labor, unspecified weeks of gestation: Principal | ICD-10-CM | POA: Diagnosis present

## 2014-03-24 DIAGNOSIS — O9989 Other specified diseases and conditions complicating pregnancy, childbirth and the puerperium: Secondary | ICD-10-CM

## 2014-03-24 DIAGNOSIS — Z3490 Encounter for supervision of normal pregnancy, unspecified, unspecified trimester: Secondary | ICD-10-CM

## 2014-03-24 LAB — CBC
HCT: 34.3 % — ABNORMAL LOW (ref 36.0–46.0)
Hemoglobin: 11.5 g/dL — ABNORMAL LOW (ref 12.0–15.0)
MCH: 29.9 pg (ref 26.0–34.0)
MCHC: 33.5 g/dL (ref 30.0–36.0)
MCV: 89.1 fL (ref 78.0–100.0)
Platelets: 203 K/uL (ref 150–400)
RBC: 3.85 MIL/uL — ABNORMAL LOW (ref 3.87–5.11)
RDW: 14.9 % (ref 11.5–15.5)
WBC: 11.7 K/uL — ABNORMAL HIGH (ref 4.0–10.5)

## 2014-03-24 LAB — POCT FERN TEST: POCT Fern Test: POSITIVE

## 2014-03-24 LAB — TYPE AND SCREEN
ABO/RH(D): O POS
Antibody Screen: NEGATIVE

## 2014-03-24 LAB — RPR

## 2014-03-24 MED ORDER — OXYCODONE-ACETAMINOPHEN 5-325 MG PO TABS: 1.0000 | ORAL_TABLET | ORAL | Status: DC | PRN

## 2014-03-24 MED ORDER — PHENYLEPHRINE 40 MCG/ML (10ML) SYRINGE FOR IV PUSH (FOR BLOOD PRESSURE SUPPORT)
80.0000 ug | PREFILLED_SYRINGE | INTRAVENOUS | Status: DC | PRN
  Filled 2014-03-24: qty 2

## 2014-03-24 MED ORDER — LIDOCAINE HCL (PF) 1 % IJ SOLN
30.0000 mL | INTRAMUSCULAR | Status: DC | PRN
  Filled 2014-03-24: qty 30

## 2014-03-24 MED ORDER — PENICILLIN G POTASSIUM 5000000 UNITS IJ SOLR
2.5000 10*6.[IU] | INTRAVENOUS | Status: DC
  Filled 2014-03-24 (×5): qty 2.5

## 2014-03-24 MED ORDER — LACTATED RINGERS IV SOLN: 500.0000 mL | Freq: Once | INTRAVENOUS | Status: DC

## 2014-03-24 MED ORDER — ACETAMINOPHEN 325 MG PO TABS: 650.0000 mg | ORAL_TABLET | ORAL | Status: DC | PRN

## 2014-03-24 MED ORDER — TETANUS-DIPHTH-ACELL PERTUSSIS 5-2.5-18.5 LF-MCG/0.5 IM SUSP: 0.5000 mL | Freq: Once | INTRAMUSCULAR | Status: DC

## 2014-03-24 MED ORDER — LACTATED RINGERS IV SOLN
INTRAVENOUS | Status: DC
  Administered 2014-03-24: 09:00:00 via INTRAVENOUS
  Administered 2014-03-24: 125 mL/h via INTRAVENOUS

## 2014-03-24 MED ORDER — LIDOCAINE HCL (PF) 1 % IJ SOLN
INTRAMUSCULAR | Status: DC | PRN
  Administered 2014-03-24 (×2): 4 mL

## 2014-03-24 MED ORDER — EPHEDRINE 5 MG/ML INJ
10.0000 mg | INTRAVENOUS | Status: DC | PRN
  Filled 2014-03-24: qty 2

## 2014-03-24 MED ORDER — LANOLIN HYDROUS EX OINT: TOPICAL_OINTMENT | CUTANEOUS | Status: DC | PRN

## 2014-03-24 MED ORDER — WITCH HAZEL-GLYCERIN EX PADS: 1.0000 "application " | MEDICATED_PAD | CUTANEOUS | Status: DC | PRN

## 2014-03-24 MED ORDER — DIPHENHYDRAMINE HCL 50 MG/ML IJ SOLN: 12.5000 mg | INTRAMUSCULAR | Status: DC | PRN

## 2014-03-24 MED ORDER — FENTANYL 2.5 MCG/ML BUPIVACAINE 1/10 % EPIDURAL INFUSION (WH - ANES)
INTRAMUSCULAR | Status: DC | PRN
  Administered 2014-03-24: 14 mL/h via EPIDURAL

## 2014-03-24 MED ORDER — OXYTOCIN BOLUS FROM INFUSION: 500.0000 mL | INTRAVENOUS | Status: DC

## 2014-03-24 MED ORDER — OXYCODONE-ACETAMINOPHEN 5-325 MG PO TABS: 2.0000 | ORAL_TABLET | ORAL | Status: DC | PRN

## 2014-03-24 MED ORDER — FENTANYL 2.5 MCG/ML BUPIVACAINE 1/10 % EPIDURAL INFUSION (WH - ANES)
14.0000 mL/h | INTRAMUSCULAR | Status: DC | PRN
  Filled 2014-03-24: qty 125

## 2014-03-24 MED ORDER — PENICILLIN G POTASSIUM 5000000 UNITS IJ SOLR
5.0000 10*6.[IU] | Freq: Once | INTRAMUSCULAR | Status: AC
  Administered 2014-03-24: 5 10*6.[IU] via INTRAVENOUS
  Filled 2014-03-24: qty 5

## 2014-03-24 MED ORDER — SENNOSIDES-DOCUSATE SODIUM 8.6-50 MG PO TABS
2.0000 | ORAL_TABLET | ORAL | Status: DC
  Administered 2014-03-24 – 2014-03-26 (×2): 2 via ORAL
  Filled 2014-03-24 (×2): qty 2

## 2014-03-24 MED ORDER — ONDANSETRON HCL 4 MG/2ML IJ SOLN: 4.0000 mg | INTRAMUSCULAR | Status: DC | PRN

## 2014-03-24 MED ORDER — DIPHENHYDRAMINE HCL 25 MG PO CAPS: 25.0000 mg | ORAL_CAPSULE | Freq: Four times a day (QID) | ORAL | Status: DC | PRN

## 2014-03-24 MED ORDER — BENZOCAINE-MENTHOL 20-0.5 % EX AERO
1.0000 "application " | INHALATION_SPRAY | CUTANEOUS | Status: DC | PRN
  Administered 2014-03-24: 1 via TOPICAL
  Filled 2014-03-24: qty 56

## 2014-03-24 MED ORDER — ZOLPIDEM TARTRATE 5 MG PO TABS: 5.0000 mg | ORAL_TABLET | Freq: Every evening | ORAL | Status: DC | PRN

## 2014-03-24 MED ORDER — DIBUCAINE 1 % RE OINT: 1.0000 "application " | TOPICAL_OINTMENT | RECTAL | Status: DC | PRN

## 2014-03-24 MED ORDER — IBUPROFEN 600 MG PO TABS
600.0000 mg | ORAL_TABLET | Freq: Four times a day (QID) | ORAL | Status: DC
  Administered 2014-03-24 – 2014-03-26 (×7): 600 mg via ORAL
  Filled 2014-03-24 (×7): qty 1

## 2014-03-24 MED ORDER — ONDANSETRON HCL 4 MG/2ML IJ SOLN: 4.0000 mg | Freq: Four times a day (QID) | INTRAMUSCULAR | Status: DC | PRN

## 2014-03-24 MED ORDER — PHENYLEPHRINE 40 MCG/ML (10ML) SYRINGE FOR IV PUSH (FOR BLOOD PRESSURE SUPPORT)
80.0000 ug | PREFILLED_SYRINGE | INTRAVENOUS | Status: DC | PRN
  Filled 2014-03-24: qty 2
  Filled 2014-03-24: qty 10

## 2014-03-24 MED ORDER — OXYTOCIN 40 UNITS IN LACTATED RINGERS INFUSION - SIMPLE MED
1.0000 m[IU]/min | INTRAVENOUS | Status: DC
  Administered 2014-03-24: 2 m[IU]/min via INTRAVENOUS
  Filled 2014-03-24: qty 1000

## 2014-03-24 MED ORDER — ONDANSETRON HCL 4 MG PO TABS: 4.0000 mg | ORAL_TABLET | ORAL | Status: DC | PRN

## 2014-03-24 MED ORDER — TERBUTALINE SULFATE 1 MG/ML IJ SOLN: 0.2500 mg | Freq: Once | INTRAMUSCULAR | Status: DC | PRN

## 2014-03-24 MED ORDER — CITRIC ACID-SODIUM CITRATE 334-500 MG/5ML PO SOLN: 30.0000 mL | ORAL | Status: DC | PRN

## 2014-03-24 MED ORDER — SIMETHICONE 80 MG PO CHEW
80.0000 mg | CHEWABLE_TABLET | ORAL | Status: DC | PRN
Start: 2014-03-24 — End: 2014-03-26

## 2014-03-24 MED ORDER — OXYTOCIN 40 UNITS IN LACTATED RINGERS INFUSION - SIMPLE MED: 62.5000 mL/h | INTRAVENOUS | Status: DC

## 2014-03-24 MED ORDER — LACTATED RINGERS IV SOLN
500.0000 mL | INTRAVENOUS | Status: DC | PRN
  Administered 2014-03-24: 500 mL via INTRAVENOUS

## 2014-03-24 MED ORDER — PRENATAL MULTIVITAMIN CH
1.0000 | ORAL_TABLET | Freq: Every day | ORAL | Status: DC
  Administered 2014-03-25: 1 via ORAL
  Filled 2014-03-24: qty 1

## 2014-03-24 MED ORDER — FLEET ENEMA 7-19 GM/118ML RE ENEM: 1.0000 | ENEMA | RECTAL | Status: DC | PRN
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
## 2014-03-24 NOTE — Anesthesia Postprocedure Evaluation (Signed)
  Anesthesia Post-op Note  Anesthesia Post Note  Patient: [name]  Procedure(s) Performed: * No procedures listed *  Anesthesia type: Epidural  Patient location: Mother/Baby  Post pain: Pain level controlled  Post assessment: Post-op Vital signs reviewed  Last Vitals:  Filed Vitals:   03/24/14 1500  BP: 116/68  Pulse: 69  Temp: 36.8 C  Resp: 18    Post vital signs: Reviewed  Level of consciousness:alert  Complications: No apparent anesthesia complications

## 2014-03-24 NOTE — MAU Note (Signed)
Pt reports SROM at 0520-clear fluid. Contractions every 3 mins. Denies vag bleeding. +FM. +GBS
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
## 2014-03-24 NOTE — Lactation Note (Signed)
This note was copied from the chart of Deborah Betzayda Braxton. Lactation Consultation Note Initial visit at 5 hours of age.  Mom reports a good feeding and denies pain or concerns.  MOm has experience with older child breastfeeding for 2 months then she had to return to work.  Mom has arrangements to pump when she returns to this job.  Baby asleep in crib.  [employer] LC resources given and discussed.  Encouraged to feed with early cues on demand.  Early newborn behavior discussed.  Hand expression demonstrated with colostrum visible prior to this visit per mom.   Mom to call for assist as needed.    Patient Name: [name] ZOXWR'U Date: 03/24/2014 Reason for consult: Initial assessment   Maternal Data Has patient been taught Hand Expression?: Yes Does the patient have breastfeeding experience prior to this delivery?: Yes  Feeding Feeding Type: Breast Fed Length of feed: 10 min  LATCH Score/Interventions Latch: Repeated attempts needed to sustain latch, nipple held in mouth throughout feeding, stimulation needed to elicit sucking reflex. Intervention(s): Assist with latch  Audible Swallowing: A few with stimulation Intervention(s): Skin to skin;Hand expression  Type of Nipple: Everted at rest and after stimulation  Comfort (Breast/Nipple): Soft / non-tender     Hold (Positioning): Full assist, staff holds infant at breast Intervention(s): Breastfeeding basics reviewed  LATCH Score: 6  Lactation Tools Discussed/Used     Consult Status Consult Status: Follow-up Date: 03/25/14 Follow-up type: In-patient    [name] 03/24/2014, 5:54 PM

## 2014-03-24 NOTE — Anesthesia Procedure Notes (Signed)
Epidural Patient location during procedure: OB Start time: 03/24/2014 9:10 AM  Staffing Anesthesiologist: Laneisha Mino A. Performed by: anesthesiologist   Preanesthetic Checklist Completed: patient identified, site marked, surgical consent, pre-op evaluation, timeout performed, IV checked, risks and benefits discussed and monitors and equipment checked  Epidural Patient position: sitting Prep: site prepped and draped and DuraPrep Patient monitoring: continuous pulse ox and blood pressure Approach: midline Location: L3-L4 Injection technique: LOR air  Needle:  Needle type: Tuohy  Needle gauge: 17 G Needle length: 9 cm and 9 Needle insertion depth: 6 cm Catheter type: closed end flexible Catheter size: 19 Gauge Catheter at skin depth: 11 cm Test dose: negative and Other  Assessment Events: blood not aspirated, injection not painful, no injection resistance, negative IV test and no paresthesia  Additional Notes Patient identified. Risks and benefits discussed including failed block, incomplete  Pain control, post dural puncture headache, nerve damage, paralysis, blood pressure Changes, nausea, vomiting, reactions to medications-both toxic and allergic and post Partum back pain. All questions were answered. Patient expressed understanding and wished to proceed. Sterile technique was used throughout procedure. Epidural site was Dressed with sterile barrier dressing. No paresthesias, signs of intravascular injection Or signs of intrathecal spread were encountered.  Patient was more comfortable after the epidural was dosed. Please see RN's note for documentation of vital signs and FHR which are stable.

## 2014-03-25 LAB — CBC
HCT: 32.3 % — ABNORMAL LOW (ref 36.0–46.0)
HEMOGLOBIN: 10.7 g/dL — AB (ref 12.0–15.0)
MCH: 29.6 pg (ref 26.0–34.0)
MCHC: 33.1 g/dL (ref 30.0–36.0)
MCV: 89.5 fL (ref 78.0–100.0)
Platelets: 199 10*3/uL (ref 150–400)
RBC: 3.61 MIL/uL — AB (ref 3.87–5.11)
RDW: 15 % (ref 11.5–15.5)
WBC: 13.1 10*3/uL — ABNORMAL HIGH (ref 4.0–10.5)

## 2014-03-25 NOTE — Lactation Note (Signed)
This note was copied from the chart of [name] Outten. Lactation Consultation Note  Patient Name: [name] Radoncic ZOXWR'UToday's Date: 03/25/2014 Reason for consult: Follow-up assessment  Baby 33 hours of life. Mom reports baby nurse earlier and is latching better.  Assisted mom to latch baby to left breast, baby's gape nice and wide. Mom able to readily express colostrum. Baby suckling rhythmically, lips flanged, with a few swallows noted. Enc mom to feed with cues and at least 8-12 times/24 hours. Mom states she feels much more comfortable with latching baby to breast.  Maternal Data    Feeding Feeding Type: Breast Fed Length of feed:  (LC assessed first 15 minutes of BF.)  LATCH Score/Interventions Latch: Grasps breast easily, tongue down, lips flanged, rhythmical sucking.  Audible Swallowing: A few with stimulation  Type of Nipple: Everted at rest and after stimulation  Comfort (Breast/Nipple): Soft / non-tender     Hold (Positioning): Assistance needed to correctly position infant at breast and maintain latch.  LATCH Score: 8  Lactation Tools Discussed/Used     Consult Status Consult Status: Follow-up Date: 03/26/14 Follow-up type: In-patient    Geralynn OchsWILLIARD, Royce Sciara 03/25/2014, 9:54 PM

## 2014-03-25 NOTE — Progress Notes (Signed)
Post Partum Day 1 Subjective: no complaints, up ad lib, voiding, tolerating PO and + flatus  Objective: Blood pressure 131/75, pulse 84, temperature 98.3 F (36.8 C), temperature source Oral, resp. rate 16, height 5' 7.4" (1.712 m), weight 226 lb (102.513 kg), SpO2 98.00%, unknown if currently breastfeeding.  Physical Exam:  General: alert and cooperative Lochia: appropriate Uterine Fundus: firm Incision: perineum intact DVT Evaluation: No evidence of DVT seen on physical exam. Negative Homan's sign. No cords or calf tenderness. No significant calf/ankle edema.   Recent Labs  03/24/14 0715 03/25/14 0630  HGB 11.5* 10.7*  HCT 34.3* 32.3*    Assessment/Plan: Plan for discharge tomorrow and Circumcision prior to discharge   LOS: 1 day   CURTIS,CAROL G 03/25/2014, 8:36 AM

## 2014-03-26 MED ORDER — IBUPROFEN 600 MG PO TABS: 600.0000 mg | ORAL_TABLET | Freq: Four times a day (QID) | ORAL | Status: DC

## 2014-03-26 MED ORDER — OXYCODONE-ACETAMINOPHEN 5-325 MG PO TABS: 1.0000 | ORAL_TABLET | ORAL | Status: DC | PRN

## 2014-03-26 NOTE — Discharge Summary (Signed)
Obstetric Discharge Summary Reason for Admission: rupture of membranes Prenatal Procedures: ultrasound Intrapartum Procedures: spontaneous vaginal delivery Postpartum Procedures: none Complications-Operative and Postpartum: periurethral laceration Hemoglobin  Date Value Ref Range Status  03/25/2014 10.7* 12.0 - 15.0 g/dL Final     HCT  Date Value Ref Range Status  03/25/2014 32.3* 36.0 - 46.0 % Final    Physical Exam:  General: alert and cooperative Lochia: appropriate Uterine Fundus: firm Incision: healing well DVT Evaluation: No evidence of DVT seen on physical exam. Negative Homan's sign. No cords or calf tenderness.  Discharge Diagnoses: Term Pregnancy-delivered  Discharge Information: Date: 03/26/2014 Activity: pelvic rest Diet: routine Medications: PNV, Ibuprofen and Percocet Condition: stable Instructions: refer to practice specific booklet Discharge to: home   Newborn Data: Live born female  Birth Weight: 6 lb 10.9 oz (3030 g) APGAR: 9, 9  Home with mother.  Rinaldo Macqueen G 03/26/2014, 8:48 AM

## 2014-03-27 ENCOUNTER — Inpatient Hospital Stay (HOSPITAL_COMMUNITY): Admission: RE | Admit: 2014-03-27 | Payer: 59 | Source: Ambulatory Visit

## 2014-04-28 ENCOUNTER — Encounter (HOSPITAL_COMMUNITY): Payer: Self-pay

## 2014-05-12 ENCOUNTER — Other Ambulatory Visit: Payer: Self-pay | Admitting: Obstetrics and Gynecology

## 2014-05-14 LAB — CYTOLOGY - PAP

## 2014-11-11 ENCOUNTER — Encounter (HOSPITAL_BASED_OUTPATIENT_CLINIC_OR_DEPARTMENT_OTHER): Payer: Self-pay

## 2014-11-11 ENCOUNTER — Emergency Department (HOSPITAL_BASED_OUTPATIENT_CLINIC_OR_DEPARTMENT_OTHER): Payer: Medicaid Other

## 2014-11-11 ENCOUNTER — Emergency Department (HOSPITAL_BASED_OUTPATIENT_CLINIC_OR_DEPARTMENT_OTHER)
Admission: EM | Admit: 2014-11-11 | Discharge: 2014-11-11 | Disposition: A | Discharge status: Home / Self Care | Payer: Medicaid Other | Attending: Emergency Medicine | Admitting: Emergency Medicine

## 2014-11-11 DIAGNOSIS — Y9289 Other specified places as the place of occurrence of the external cause: Secondary | ICD-10-CM | POA: Diagnosis not present

## 2014-11-11 DIAGNOSIS — Y9389 Activity, other specified: Secondary | ICD-10-CM | POA: Insufficient documentation

## 2014-11-11 DIAGNOSIS — G43909 Migraine, unspecified, not intractable, without status migrainosus: Secondary | ICD-10-CM | POA: Insufficient documentation

## 2014-11-11 DIAGNOSIS — S4991XA Unspecified injury of right shoulder and upper arm, initial encounter: Secondary | ICD-10-CM | POA: Insufficient documentation

## 2014-11-11 DIAGNOSIS — Z87828 Personal history of other (healed) physical injury and trauma: Secondary | ICD-10-CM | POA: Diagnosis not present

## 2014-11-11 DIAGNOSIS — X58XXXA Exposure to other specified factors, initial encounter: Secondary | ICD-10-CM | POA: Insufficient documentation

## 2014-11-11 DIAGNOSIS — M542 Cervicalgia: Secondary | ICD-10-CM

## 2014-11-11 DIAGNOSIS — Z3202 Encounter for pregnancy test, result negative: Secondary | ICD-10-CM | POA: Diagnosis not present

## 2014-11-11 DIAGNOSIS — S199XXA Unspecified injury of neck, initial encounter: Secondary | ICD-10-CM | POA: Insufficient documentation

## 2014-11-11 DIAGNOSIS — M436 Torticollis: Secondary | ICD-10-CM

## 2014-11-11 DIAGNOSIS — Y998 Other external cause status: Secondary | ICD-10-CM | POA: Insufficient documentation

## 2014-11-11 DIAGNOSIS — Z87891 Personal history of nicotine dependence: Secondary | ICD-10-CM | POA: Diagnosis not present

## 2014-11-11 LAB — PREGNANCY, URINE: PREG TEST UR: NEGATIVE

## 2014-11-11 MED ORDER — CYCLOBENZAPRINE HCL 5 MG PO TABS: 5.0000 mg | ORAL_TABLET | Freq: Every day | ORAL | Status: DC | PRN

## 2014-11-11 MED ORDER — CYCLOBENZAPRINE HCL 5 MG PO TABS: 5.0000 mg | ORAL_TABLET | Freq: Two times a day (BID) | ORAL | Status: DC | PRN

## 2014-11-11 MED ORDER — ACETAMINOPHEN 325 MG PO TABS
650.0000 mg | ORAL_TABLET | Freq: Once | ORAL | Status: AC
  Administered 2014-11-11: 650 mg via ORAL
  Filled 2014-11-11: qty 2

## 2014-11-11 NOTE — Discharge Instructions (Signed)
Please call your doctor for a followup appointment within 24-48 hours. When you talk to your doctor please let them know that you were seen in the emergency department and have them acquire all of your records so that they can discuss the findings with you and formulate a treatment plan to fully care for your new and ongoing problems. Please follow-up to primary care provider within 24-48 hours Please follow-up with orthopedics Please rest and stay hydrated Please take medications as prescribed. While taking muscle relaxer there is to be no drinking alcohol, driving, operating any heavy machinery. Muscle relaxers can cause drowsiness of please do not take with any other medication that can cause sedating factors. Please do not exceed more than 10 mg per day for this can lead to sedation. Please apply warm compressions and massage Please avoid any physical or strenuous activity Please continue to monitor symptoms closely and if symptoms are to worsen or change (fever greater than 101, chills, sweating, nausea, vomiting, chest pain, shortness of breathe, difficulty breathing, weakness, numbness, tingling, worsening or changes to pain pattern, fall, injury, numbness, tingling, jaw pain, swelling to the arm or neck, visual changes, difficulty swallowing or inability to swallow) please report back to the Emergency Department immediately.    Cervical Strain and Sprain (Whiplash) with Rehab Cervical strain and sprain are injuries that commonly occur with "whiplash" injuries. Whiplash occurs when the neck is forcefully whipped backward or forward, such as during a motor vehicle accident or during contact sports. The muscles, ligaments, tendons, discs, and nerves of the neck are susceptible to injury when this occurs. RISK FACTORS Risk of having a whiplash injury increases if:  Osteoarthritis of the spine.  Situations that make head or neck accidents or trauma more likely.  High-risk sports (football,  rugby, wrestling, hockey, auto racing, gymnastics, diving, contact karate, or boxing).  Poor strength and flexibility of the neck.  Previous neck injury.  Poor tackling technique.  Improperly fitted or padded equipment. SYMPTOMS   Pain or stiffness in the front or back of neck or both.  Symptoms may present immediately or up to 24 hours after injury.  Dizziness, headache, nausea, and vomiting.  Muscle spasm with soreness and stiffness in the neck.  Tenderness and swelling at the injury site. PREVENTION  Learn and use proper technique (avoid tackling with the head, spearing, and head-butting; use proper falling techniques to avoid landing on the head).  Warm up and stretch properly before activity.  Maintain physical fitness:  Strength, flexibility, and endurance.  Cardiovascular fitness.  Wear properly fitted and padded protective equipment, such as padded soft collars, for participation in contact sports. PROGNOSIS  Recovery from cervical strain and sprain injuries is dependent on the extent of the injury. These injuries are usually curable in 1 week to 3 months with appropriate treatment.  RELATED COMPLICATIONS   Temporary numbness and weakness may occur if the nerve roots are damaged, and this may persist until the nerve has completely healed.  Chronic pain due to frequent recurrence of symptoms.  Prolonged healing, especially if activity is resumed too soon (before complete recovery). TREATMENT  Treatment initially involves the use of ice and medication to help reduce pain and inflammation. It is also important to perform strengthening and stretching exercises and modify activities that worsen symptoms so the injury does not get worse. These exercises may be performed at home or with a therapist. For patients who experience severe symptoms, a soft, padded collar may be recommended to be worn  around the neck.  Improving your posture may help reduce symptoms. Posture  improvement includes pulling your chin and abdomen in while sitting or standing. If you are sitting, sit in a firm chair with your buttocks against the back of the chair. While sleeping, try replacing your pillow with a small towel rolled to 2 inches in diameter, or use a cervical pillow or soft cervical collar. Poor sleeping positions delay healing.  For patients with nerve root damage, which causes numbness or weakness, the use of a cervical traction apparatus may be recommended. Surgery is rarely necessary for these injuries. However, cervical strain and sprains that are present at birth (congenital) may require surgery. MEDICATION   If pain medication is necessary, nonsteroidal anti-inflammatory medications, such as aspirin and ibuprofen, or other minor pain relievers, such as acetaminophen, are often recommended.  Do not take pain medication for 7 days before surgery.  Prescription pain relievers may be given if deemed necessary by your caregiver. Use only as directed and only as much as you need. HEAT AND COLD:   Cold treatment (icing) relieves pain and reduces inflammation. Cold treatment should be applied for 10 to 15 minutes every 2 to 3 hours for inflammation and pain and immediately after any activity that aggravates your symptoms. Use ice packs or an ice massage.  Heat treatment may be used prior to performing the stretching and strengthening activities prescribed by your caregiver, physical therapist, or athletic trainer. Use a heat pack or a warm soak. SEEK MEDICAL CARE IF:   Symptoms get worse or do not improve in 2 weeks despite treatment.  New, unexplained symptoms develop (drugs used in treatment may produce side effects). EXERCISES RANGE OF MOTION (ROM) AND STRETCHING EXERCISES - Cervical Strain and Sprain These exercises may help you when beginning to rehabilitate your injury. In order to successfully resolve your symptoms, you must improve your posture. These exercises are  designed to help reduce the forward-head and rounded-shoulder posture which contributes to this condition. Your symptoms may resolve with or without further involvement from your physician, physical therapist or athletic trainer. While completing these exercises, remember:   Restoring tissue flexibility helps normal motion to return to the joints. This allows healthier, less painful movement and activity.  An effective stretch should be held for at least 20 seconds, although you may need to begin with shorter hold times for comfort.  A stretch should never be painful. You should only feel a gentle lengthening or release in the stretched tissue. STRETCH- Axial Extensors  Lie on your back on the floor. You may bend your knees for comfort. Place a rolled-up hand towel or dish towel, about 2 inches in diameter, under the part of your head that makes contact with the floor.  Gently tuck your chin, as if trying to make a "double chin," until you feel a gentle stretch at the base of your head.  Hold __________ seconds. Repeat __________ times. Complete this exercise __________ times per day.  STRETCH - Axial Extension   Stand or sit on a firm surface. Assume a good posture: chest up, shoulders drawn back, abdominal muscles slightly tense, knees unlocked (if standing) and feet hip width apart.  Slowly retract your chin so your head slides back and your chin slightly lowers. Continue to look straight ahead.  You should feel a gentle stretch in the back of your head. Be certain not to feel an aggressive stretch since this can cause headaches later.  Hold for __________ seconds. Repeat  __________ times. Complete this exercise __________ times per day. STRETCH - Cervical Side Bend   Stand or sit on a firm surface. Assume a good posture: chest up, shoulders drawn back, abdominal muscles slightly tense, knees unlocked (if standing) and feet hip width apart.  Without letting your nose or shoulders  move, slowly tip your right / left ear to your shoulder until your feel a gentle stretch in the muscles on the opposite side of your neck.  Hold __________ seconds. Repeat __________ times. Complete this exercise __________ times per day. STRETCH - Cervical Rotators   Stand or sit on a firm surface. Assume a good posture: chest up, shoulders drawn back, abdominal muscles slightly tense, knees unlocked (if standing) and feet hip width apart.  Keeping your eyes level with the ground, slowly turn your head until you feel a gentle stretch along the back and opposite side of your neck.  Hold __________ seconds. Repeat __________ times. Complete this exercise __________ times per day. RANGE OF MOTION - Neck Circles   Stand or sit on a firm surface. Assume a good posture: chest up, shoulders drawn back, abdominal muscles slightly tense, knees unlocked (if standing) and feet hip width apart.  Gently roll your head down and around from the back of one shoulder to the back of the other. The motion should never be forced or painful.  Repeat the motion 10-20 times, or until you feel the neck muscles relax and loosen. Repeat __________ times. Complete the exercise __________ times per day. STRENGTHENING EXERCISES - Cervical Strain and Sprain These exercises may help you when beginning to rehabilitate your injury. They may resolve your symptoms with or without further involvement from your physician, physical therapist, or athletic trainer. While completing these exercises, remember:   Muscles can gain both the endurance and the strength needed for everyday activities through controlled exercises.  Complete these exercises as instructed by your physician, physical therapist, or athletic trainer. Progress the resistance and repetitions only as guided.  You may experience muscle soreness or fatigue, but the pain or discomfort you are trying to eliminate should never worsen during these exercises. If this  pain does worsen, stop and make certain you are following the directions exactly. If the pain is still present after adjustments, discontinue the exercise until you can discuss the trouble with your clinician. STRENGTH - Cervical Flexors, Isometric  Face a wall, standing about 6 inches away. Place a small pillow, a ball about 6-8 inches in diameter, or a folded towel between your forehead and the wall.  Slightly tuck your chin and gently push your forehead into the soft object. Push only with mild to moderate intensity, building up tension gradually. Keep your jaw and forehead relaxed.  Hold 10 to 20 seconds. Keep your breathing relaxed.  Release the tension slowly. Relax your neck muscles completely before you start the next repetition. Repeat __________ times. Complete this exercise __________ times per day. STRENGTH- Cervical Lateral Flexors, Isometric   Stand about 6 inches away from a wall. Place a small pillow, a ball about 6-8 inches in diameter, or a folded towel between the side of your head and the wall.  Slightly tuck your chin and gently tilt your head into the soft object. Push only with mild to moderate intensity, building up tension gradually. Keep your jaw and forehead relaxed.  Hold 10 to 20 seconds. Keep your breathing relaxed.  Release the tension slowly. Relax your neck muscles completely before you start the next repetition.  Repeat __________ times. Complete this exercise __________ times per day. STRENGTH - Cervical Extensors, Isometric   Stand about 6 inches away from a wall. Place a small pillow, a ball about 6-8 inches in diameter, or a folded towel between the back of your head and the wall.  Slightly tuck your chin and gently tilt your head back into the soft object. Push only with mild to moderate intensity, building up tension gradually. Keep your jaw and forehead relaxed.  Hold 10 to 20 seconds. Keep your breathing relaxed.  Release the tension slowly. Relax  your neck muscles completely before you start the next repetition. Repeat __________ times. Complete this exercise __________ times per day. POSTURE AND BODY MECHANICS CONSIDERATIONS - Cervical Strain and Sprain Keeping correct posture when sitting, standing or completing your activities will reduce the stress put on different body tissues, allowing injured tissues a chance to heal and limiting painful experiences. The following are general guidelines for improved posture. Your physician or physical therapist will provide you with any instructions specific to your needs. While reading these guidelines, remember:  The exercises prescribed by your provider will help you have the flexibility and strength to maintain correct postures.  The correct posture provides the optimal environment for your joints to work. All of your joints have less wear and tear when properly supported by a spine with good posture. This means you will experience a healthier, less painful body.  Correct posture must be practiced with all of your activities, especially prolonged sitting and standing. Correct posture is as important when doing repetitive low-stress activities (typing) as it is when doing a single heavy-load activity (lifting). PROLONGED STANDING WHILE SLIGHTLY LEANING FORWARD When completing a task that requires you to lean forward while standing in one place for a long time, place either foot up on a stationary 2- to 4-inch high object to help maintain the best posture. When both feet are on the ground, the low back tends to lose its slight inward curve. If this curve flattens (or becomes too large), then the back and your other joints will experience too much stress, fatigue more quickly, and can cause pain.  RESTING POSITIONS Consider which positions are most painful for you when choosing a resting position. If you have pain with flexion-based activities (sitting, bending, stooping, squatting), choose a position  that allows you to rest in a less flexed posture. You would want to avoid curling into a fetal position on your side. If your pain worsens with extension-based activities (prolonged standing, working overhead), avoid resting in an extended position such as sleeping on your stomach. Most people will find more comfort when they rest with their spine in a more neutral position, neither too rounded nor too arched. Lying on a non-sagging bed on your side with a pillow between your knees, or on your back with a pillow under your knees will often provide some relief. Keep in mind, being in any one position for a prolonged period of time, no matter how correct your posture, can still lead to stiffness. WALKING Walk with an upright posture. Your ears, shoulders, and hips should all line up. OFFICE WORK When working at a desk, create an environment that supports good, upright posture. Without extra support, muscles fatigue and lead to excessive strain on joints and other tissues. CHAIR:  A chair should be able to slide under your desk when your back makes contact with the back of the chair. This allows you to work closely.  The  chair's height should allow your eyes to be level with the upper part of your monitor and your hands to be slightly lower than your elbows.  Body position:  Your feet should make contact with the floor. If this is not possible, use a foot rest.  Keep your ears over your shoulders. This will reduce stress on your neck and low back. Document Released: 06/13/2005 Document Revised: 10/28/2013 Document Reviewed: 09/25/2008 [facility] Patient Information 2015 [place], [place]. This information is not intended to replace advice given to you by your health care provider. Make sure you discuss any questions you have with your health care provider.   Emergency Department Resource Guide 1) Find a Doctor and Pay Out of Pocket Although you won't have to find out who is covered by your insurance  plan, it is a good idea to ask around and get recommendations. You will then need to call the office and see if the doctor you have chosen will accept you as a new patient and what types of options they offer for patients who are self-pay. Some doctors offer discounts or will set up payment plans for their patients who do not have insurance, but you will need to ask so you aren't surprised when you get to your appointment.  2) Contact Your Local Health Department Not all health departments have doctors that can see patients for sick visits, but many do, so it is worth a call to see if yours does. If you don't know where your local health department is, you can check in your phone book. The CDC also has a tool to help you locate your state's health department, and many state websites also have listings of all of their local health departments.  3) Find a Walk-in Clinic If your illness is not likely to be very severe or complicated, you may want to try a walk in clinic. These are popping up all over the country in pharmacies, drugstores, and shopping centers. They're usually staffed by nurse practitioners or physician assistants that have been trained to treat common illnesses and complaints. They're usually fairly quick and inexpensive. However, if you have serious medical issues or chronic medical problems, these are probably not your best option.  No Primary Care Doctor: - Call Health Connect at  [phone] - they can help you locate a primary care doctor that  accepts your insurance, provides certain services, etc. - Physician Referral Service- [phone]  Chronic Pain Problems: Organization         Address  Phone   Notes  [facility]  [phone] Patients need to be referred by their primary care doctor.   Medication Assistance: Organization         Address  Phone   Notes  [facility] Medication [facility] [address] [place] [place], [place] [address]  [phone] --Must be a resident of [facility] -- Must have NO insurance coverage whatsoever (no Medicaid/ Medicare, etc.) -- The pt. MUST have a primary care doctor that directs their care regularly and follows them in the community   [facility]  [phone]   [employer]  [phone]    Agencies that provide inexpensive medical care: Organization         Address  Phone   Notes  [facility]  [phone]   [facility]    [phone]   [facility] [address] [place], [place] [address] [phone]  [phone]   [facility] [address] [place] [phone]   Planned Parenthood    [phone]   [facility]    [phone]   [facility]  [address], [place] Phone:  [phone], Fax:  [phone] Hours of Operation:  9 am - 6 pm, M-F.  Also accepts Medicaid/Medicare and self-pay.  [facility]  [address], [place] Phone: [phone], Fax: [phone]. Hours of Operation:  8:30 am - 5:30 pm, M-F.  Also accepts Medicaid and self-pay.  [facility] [place] [address], [place] Phone: [phone]   [facility] [address] Natasha Bence [place], [place] [phone], Ext. [phone] Mondays & Thursdays: 7-9 AM.  First 15 patients are seen on a first come, first serve basis.    Medicaid-accepting [facility] Providers:  Organization         Address  Phone   Notes  [facility] [address], [place] [phone] Also accepts self-pay patients.  [facility] [address] Laurell Josephs [place], [place]  [phone]   [facility] [address], [place] [phone]   [facility] [address], [place] [phone]   Renaye Rakers [address], [place]   [phone] Only accepts [place] patients after they have their name applied to their card.   Self-Pay (no insurance) in [facility]:  Organization         Address  Phone   Notes  Sickle Cell Patients, [facility] [address] [place], [place] [phone]   [facility] [address] [place], [place] [phone]   [facility] [place]  [address], [place] [phone]   [facility]/Dr. Osei-Bonsu  [address] [place] or [address], [place] [phone] Phone number for both [place] and [place] locations is the same.  [facility] [address], [place] [phone]   [facility] [address], [place] or [address] [phone]   [facility] [address] [place] [phone], phone; [phone], fax Sees patients 1st and 3rd Saturday of every month.  Must not qualify for public or private insurance (i.e. Medicaid, Medicare, [place] Health Choice, Veterans' Benefits)  Household income should be no more than 200% of the poverty level The clinic cannot treat you if you are pregnant or think you are pregnant  Sexually transmitted diseases are not treated at the clinic.    Dental Care: Organization         Address  Phone  Notes  [facility] Department of [facility] [address] [place], [place] [phone] Accepts children up to age 60 who are enrolled in [place] or [place] Health Choice; pregnant women with a Medicaid card; and children who have applied for Medicaid or [employer] Health Choice, but were declined, whose parents can pay a reduced fee at time of service.  [facility] of [facility]  [address], [place] [phone] Accepts children up to age 9 who are enrolled in [place] or [place] Health  Choice; pregnant women with a  Medicaid card; and children who have applied for Medicaid or [place] Health Choice, but were declined, whose parents can pay a reduced fee at time of service.  [facility]  [address] [place], [place] [phone] Patients are seen by appointment only. Walk-ins are not accepted. [facility] will see patients 43 years of age and older. Monday - Tuesday (8am-5pm) Most Wednesdays (8:30-5pm) $30 per visit, cash only  [facility]  [address], [facility] [phone] Patients are seen by appointment only. Walk-ins are not accepted. [facility] will see patients 86 years of age and older. One Wednesday Evening (Monthly: Volunteer Based).  $30 per visit, cash only  [employer] [facility] [employer]  [phone] for adults; Children under age 73, call Graduate Pediatric Dentistry at [phone]. Children aged 49-14, please call [phone] to request a pediatric application.  Dental services are provided in all areas of dental care including fillings, crowns and bridges, complete and partial dentures, implants, gum treatment, root canals, and extractions. Preventive care is also provided. Treatment is provided to both adults and children. Patients are selected via a lottery and there is often a waiting list.   [facility] [address] [place]  [phone] [email or web address]   [facility] [address] [place], [place] [phone], Ext. [phone] Second and Fourth Thursday of each month, opens at 6:30 AM; Clinic ends at 9 AM.  Patients are seen on a first-come first-served basis, and a limited number are seen during each clinic.   [facility]  [address] [place] [address] Ether Griff[address] [place], [place] [phone]   Eligibility Requirements You must have lived in [place], [place], or [place] counties for at least the last three months.   You cannot be eligible for state or  federal sponsored [place], including [employer], [place], or [employer].   You generally cannot be eligible for healthcare insurance through your employer.    How to apply: Eligibility screenings are held every Tuesday and Wednesday afternoon from 1:00 pm until 4:00 pm. You do not need an appointment for the interview!  [facility] [address] [place], [place] [phone]   [facility] Health Department  [phone]   [facility] Health Department  [phone]   [facility] Health Department  [phone]    Behavioral Health Resources in the Community: Intensive Outpatient Programs Organization         Address  Phone  Notes  [facility] [address] [place], [place] [phone]   [facility] Outpatient [address], [place], [place] [phone]   [facility] [address] [place], [place]  [phone]   [facility] [address]  [place], [place] [phone] or [phone]   Substance Abuse Resources Organization         Address  Phone  Notes  [facility]  [phone]   [facility]  [phone]   [facility] [place]  [phone]   Floydene Flock  [phone]   Residential & Outpatient Substance Abuse Program  [phone]   Psychological Services Organization         Address  Phone  Notes  [facility] Behavioral Health  [phone]   [facility]  [phone]   [facility] [address] [place] [phone] or [phone]    Mobile Crisis Teams Organization  Address  Phone  Notes  [facility], Mobile Crisis Care Unit  [phone]   [facility]  [address] [place], [place] [phone]   [facility] [address] [place] [phone]    Self-Help/Support Groups Organization         Address  Phone              Notes  [facility] - variety of support groups  [phone]- I7437963 Call for more information  Narcotics Anonymous (NA), [facility] [address], [employer] [place]  2 meetings at this location   Statistician         Address  Phone  Notes  [facility] [address],    [place]  [phone]   [facility]  [address], [place] [address], [place], [place] [phone]   [facility] [address] [place], [place] [phone] Admissions: 8am-3pm M-F  [facility] [address]    [place], [place] [phone]   [facility] [address] [place], [place], [place] [phone]   [facility] [address],  [place], [place] [phone]   [facility] - Intensive Outpatient [address], [place], [place] [phone]   [facility] ([facility] [address] [place]  [place], [place] [phone] or [phone]   [facility] ([facility]) [address] [place], [place] [phone] Accepts Medicaid  [facility] [address],  [place] [phone] Substance Abuse/Addiction Treatment   [facility] Organization         Address  Phone  Notes  [facility]  [phone]   Angie Fava, PhD [address] Knack [place], [place]   [phone] or [phone]   [facility] Behavioral   [address] [place], [place] [phone]   [facility] [address] [place], [place] [phone] Insurance/Medicaid/sponsorship through [facility] and Families [address]                                    [place], [place] [phone] Therapy/tele-psych/case  [facility] [address], [place] [phone]    Dr. Lolly Mustache  [phone]   [facility]  [employer] [facility]  Dept. 1) [address], [place] 2) [address], [place] 3)  [address], [place] [phone]  [phone]   [facility] Child Abuse Hotline [phone] or 6033940355 (After Hours)

## 2014-11-11 NOTE — ED Notes (Signed)
C/o muscle pain to right side of neck x 2 days-pain started after pt attempted "to pop my neck"
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
## 2014-11-11 NOTE — ED Notes (Signed)
PA at bedside.  Patient preparing for discharge.

## 2014-11-29 ENCOUNTER — Emergency Department (HOSPITAL_BASED_OUTPATIENT_CLINIC_OR_DEPARTMENT_OTHER)
Admission: EM | Admit: 2014-11-29 | Discharge: 2014-11-29 | Disposition: A | Discharge status: Home / Self Care | Payer: Medicaid Other | Attending: Emergency Medicine | Admitting: Emergency Medicine

## 2014-11-29 ENCOUNTER — Encounter (HOSPITAL_BASED_OUTPATIENT_CLINIC_OR_DEPARTMENT_OTHER): Payer: Self-pay | Admitting: *Deleted

## 2014-11-29 ENCOUNTER — Emergency Department (HOSPITAL_BASED_OUTPATIENT_CLINIC_OR_DEPARTMENT_OTHER): Payer: Medicaid Other

## 2014-11-29 DIAGNOSIS — J3489 Other specified disorders of nose and nasal sinuses: Secondary | ICD-10-CM | POA: Insufficient documentation

## 2014-11-29 DIAGNOSIS — R112 Nausea with vomiting, unspecified: Secondary | ICD-10-CM | POA: Diagnosis not present

## 2014-11-29 DIAGNOSIS — J029 Acute pharyngitis, unspecified: Secondary | ICD-10-CM | POA: Diagnosis not present

## 2014-11-29 DIAGNOSIS — Z3202 Encounter for pregnancy test, result negative: Secondary | ICD-10-CM | POA: Insufficient documentation

## 2014-11-29 DIAGNOSIS — Z79899 Other long term (current) drug therapy: Secondary | ICD-10-CM | POA: Insufficient documentation

## 2014-11-29 DIAGNOSIS — G43909 Migraine, unspecified, not intractable, without status migrainosus: Secondary | ICD-10-CM | POA: Insufficient documentation

## 2014-11-29 DIAGNOSIS — R51 Headache: Secondary | ICD-10-CM

## 2014-11-29 DIAGNOSIS — R519 Headache, unspecified: Secondary | ICD-10-CM

## 2014-11-29 LAB — CBC WITH DIFFERENTIAL/PLATELET
BASOS PCT: 0 % (ref 0–1)
Basophils Absolute: 0 10*3/uL (ref 0.0–0.1)
EOS PCT: 0 % (ref 0–5)
Eosinophils Absolute: 0 10*3/uL (ref 0.0–0.7)
HEMATOCRIT: 37 % (ref 36.0–46.0)
HEMOGLOBIN: 12.2 g/dL (ref 12.0–15.0)
Lymphocytes Relative: 4 % — ABNORMAL LOW (ref 12–46)
Lymphs Abs: 0.6 10*3/uL — ABNORMAL LOW (ref 0.7–4.0)
MCH: 28.6 pg (ref 26.0–34.0)
MCHC: 33 g/dL (ref 30.0–36.0)
MCV: 86.7 fL (ref 78.0–100.0)
MONO ABS: 0.2 10*3/uL (ref 0.1–1.0)
Monocytes Relative: 2 % — ABNORMAL LOW (ref 3–12)
NEUTROS ABS: 13.6 10*3/uL — AB (ref 1.7–7.7)
NEUTROS PCT: 94 % — AB (ref 43–77)
Platelets: 294 10*3/uL (ref 150–400)
RBC: 4.27 MIL/uL (ref 3.87–5.11)
RDW: 14 % (ref 11.5–15.5)
WBC: 14.4 10*3/uL — ABNORMAL HIGH (ref 4.0–10.5)

## 2014-11-29 LAB — URINALYSIS, ROUTINE W REFLEX MICROSCOPIC
Bilirubin Urine: NEGATIVE
Glucose, UA: NEGATIVE mg/dL
KETONES UR: 15 mg/dL — AB
LEUKOCYTES UA: NEGATIVE
NITRITE: NEGATIVE
Protein, ur: >300 mg/dL — AB
SPECIFIC GRAVITY, URINE: 1.005 (ref 1.005–1.030)
Urobilinogen, UA: 0.2 mg/dL (ref 0.0–1.0)
pH: 8.5 — ABNORMAL HIGH (ref 5.0–8.0)

## 2014-11-29 LAB — URINE MICROSCOPIC-ADD ON

## 2014-11-29 LAB — COMPREHENSIVE METABOLIC PANEL
ALT: 23 U/L (ref 14–54)
AST: 24 U/L (ref 15–41)
Albumin: 4.6 g/dL (ref 3.5–5.0)
Alkaline Phosphatase: 79 U/L (ref 38–126)
Anion gap: 9 (ref 5–15)
BILIRUBIN TOTAL: 0.3 mg/dL (ref 0.3–1.2)
BUN: 8 mg/dL (ref 6–20)
CO2: 25 mmol/L (ref 22–32)
Calcium: 9.3 mg/dL (ref 8.9–10.3)
Chloride: 103 mmol/L (ref 101–111)
Creatinine, Ser: 0.65 mg/dL (ref 0.44–1.00)
GFR calc Af Amer: >60 mL/min (ref >60)
Glucose, Bld: 108 mg/dL — ABNORMAL HIGH (ref 65–99)
Potassium: 3.9 mmol/L (ref 3.5–5.1)
SODIUM: 137 mmol/L (ref 135–145)
TOTAL PROTEIN: 7.8 g/dL (ref 6.5–8.1)

## 2014-11-29 LAB — LIPASE, BLOOD: Lipase: <10 U/L — ABNORMAL LOW (ref 22–51)

## 2014-11-29 LAB — PREGNANCY, URINE: PREG TEST UR: NEGATIVE

## 2014-11-29 LAB — RAPID STREP SCREEN (MED CTR MEBANE ONLY): STREPTOCOCCUS, GROUP A SCREEN (DIRECT): NEGATIVE

## 2014-11-29 MED ORDER — PROMETHAZINE HCL 25 MG PO TABS: 25.0000 mg | ORAL_TABLET | Freq: Four times a day (QID) | ORAL | Status: DC | PRN

## 2014-11-29 MED ORDER — DIPHENHYDRAMINE HCL 50 MG/ML IJ SOLN
25.0000 mg | Freq: Once | INTRAMUSCULAR | Status: AC
  Administered 2014-11-29: 25 mg via INTRAVENOUS
  Filled 2014-11-29: qty 1

## 2014-11-29 MED ORDER — ONDANSETRON HCL 4 MG/2ML IJ SOLN
4.0000 mg | Freq: Once | INTRAMUSCULAR | Status: AC
  Administered 2014-11-29: 4 mg via INTRAVENOUS
  Filled 2014-11-29: qty 2

## 2014-11-29 MED ORDER — METOCLOPRAMIDE HCL 5 MG/ML IJ SOLN
10.0000 mg | Freq: Once | INTRAMUSCULAR | Status: AC
  Administered 2014-11-29: 10 mg via INTRAVENOUS
  Filled 2014-11-29: qty 2

## 2014-11-29 MED ORDER — KETOROLAC TROMETHAMINE 30 MG/ML IJ SOLN
30.0000 mg | Freq: Once | INTRAMUSCULAR | Status: AC
  Administered 2014-11-29: 30 mg via INTRAVENOUS
  Filled 2014-11-29: qty 1

## 2014-11-29 MED ORDER — SODIUM CHLORIDE 0.9 % IV BOLUS (SEPSIS)
1000.0000 mL | Freq: Once | INTRAVENOUS | Status: AC
  Administered 2014-11-29: 1000 mL via INTRAVENOUS
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
## 2014-11-29 NOTE — ED Notes (Signed)
Lights dimmed for comfort, ice pack given
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
## 2014-11-29 NOTE — Discharge Instructions (Signed)
General Headache Without Cause °A headache is pain or discomfort felt around the head or neck area. The specific cause of a headache may not be found. There are many causes and types of headaches. A few common ones are: °· Tension headaches. °· Migraine headaches. °· Cluster headaches. °· Chronic daily headaches. °HOME CARE INSTRUCTIONS  °· Keep all follow-up appointments with your caregiver or any specialist referral. °· Only take over-the-counter or prescription medicines for pain or discomfort as directed by your caregiver. °· Lie down in a dark, quiet room when you have a headache. °· Keep a headache journal to find out what may trigger your migraine headaches. For example, write down: °¨ What you eat and drink. °¨ How much sleep you get. °¨ Any change to your diet or medicines. °· Try massage or other relaxation techniques. °· Put ice packs or heat on the head and neck. Use these 3 to 4 times per day for 15 to 20 minutes each time, or as needed. °· Limit stress. °· Sit up straight, and do not tense your muscles. °· Quit smoking if you smoke. °· Limit alcohol use. °· Decrease the amount of caffeine you drink, or stop drinking caffeine. °· Eat and sleep on a regular schedule. °· Get 7 to 9 hours of sleep, or as recommended by your caregiver. °· Keep lights dim if bright lights bother you and make your headaches worse. °SEEK MEDICAL CARE IF:  °· You have problems with the medicines you were prescribed. °· Your medicines are not working. °· You have a change from the usual headache. °· You have nausea or vomiting. °SEEK IMMEDIATE MEDICAL CARE IF:  °· Your headache becomes severe. °· You have a fever. °· You have a stiff neck. °· You have loss of vision. °· You have muscular weakness or loss of muscle control. °· You start losing your balance or have trouble walking. °· You feel faint or pass out. °· You have severe symptoms that are different from your first symptoms. °MAKE SURE YOU:  °· Understand these  instructions. °· Will watch your condition. °· Will get help right away if you are not doing well or get worse. °Document Released: 06/13/2005 Document Revised: 09/05/2011 Document Reviewed: 06/29/2011 °ExitCare® Patient Information ©2015 [employer]. This information is not intended to replace advice given to you by your health care provider. Make sure you discuss any questions you have with your health care provider. °Nausea and Vomiting °Nausea is a sick feeling that often comes before throwing up (vomiting). Vomiting is a reflex where stomach contents come out of your mouth. Vomiting can cause severe loss of body fluids (dehydration). Children and elderly adults can become dehydrated quickly, especially if they also have diarrhea. Nausea and vomiting are symptoms of a condition or disease. It is important to find the cause of your symptoms. °CAUSES  °· Direct irritation of the stomach lining. This irritation can result from increased acid production (gastroesophageal reflux disease), infection, food poisoning, taking certain medicines (such as nonsteroidal anti-inflammatory drugs), alcohol use, or tobacco use. °· Signals from the brain. These signals could be caused by a headache, heat exposure, an inner ear disturbance, increased pressure in the brain from injury, infection, a tumor, or a concussion, pain, emotional stimulus, or metabolic problems. °· An obstruction in the gastrointestinal tract (bowel obstruction). °· Illnesses such as diabetes, hepatitis, gallbladder problems, appendicitis, kidney problems, cancer, sepsis, atypical symptoms of a heart attack, or eating disorders. °· Medical treatments such as chemotherapy and   radiation. °· Receiving medicine that makes you sleep (general anesthetic) during surgery. °DIAGNOSIS °Your caregiver may ask for tests to be done if the problems do not improve after a few days. Tests may also be done if symptoms are severe or if the reason for the nausea and vomiting  is not clear. Tests may include: °· Urine tests. °· Blood tests. °· Stool tests. °· Cultures (to look for evidence of infection). °· X-rays or other imaging studies. °Test results can help your caregiver make decisions about treatment or the need for additional tests. °TREATMENT °You need to stay well hydrated. Drink frequently but in small amounts. You may wish to drink water, sports drinks, clear broth, or eat frozen ice pops or gelatin dessert to help stay hydrated. When you eat, eating slowly may help prevent nausea. There are also some antinausea medicines that may help prevent nausea. °HOME CARE INSTRUCTIONS  °· Take all medicine as directed by your caregiver. °· If you do not have an appetite, do not force yourself to eat. However, you must continue to drink fluids. °· If you have an appetite, eat a normal diet unless your caregiver tells you differently. °· Eat a variety of complex carbohydrates (rice, wheat, potatoes, bread), lean meats, yogurt, fruits, and vegetables. °· Avoid high-fat foods because they are more difficult to digest. °· Drink enough water and fluids to keep your urine clear or pale yellow. °· If you are dehydrated, ask your caregiver for specific rehydration instructions. Signs of dehydration may include: °· Severe thirst. °· Dry lips and mouth. °· Dizziness. °· Dark urine. °· Decreasing urine frequency and amount. °· Confusion. °· Rapid breathing or pulse. °SEEK IMMEDIATE MEDICAL CARE IF:  °· You have blood or brown flecks (like coffee grounds) in your vomit. °· You have black or bloody stools. °· You have a severe headache or stiff neck. °· You are confused. °· You have severe abdominal pain. °· You have chest pain or trouble breathing. °· You do not urinate at least once every 8 hours. °· You develop cold or clammy skin. °· You continue to vomit for longer than 24 to 48 hours. °· You have a fever. °MAKE SURE YOU:  °· Understand these instructions. °· Will watch your condition. °· Will  get help right away if you are not doing well or get worse. °Document Released: 06/13/2005 Document Revised: 09/05/2011 Document Reviewed: 11/10/2010 °ExitCare® Patient Information ©2015 [employer]. This information is not intended to replace advice given to you by your health care provider. Make sure you discuss any questions you have with your health care provider. ° °

## 2014-12-02 LAB — CULTURE, GROUP A STREP

## 2015-09-23 ENCOUNTER — Encounter (HOSPITAL_BASED_OUTPATIENT_CLINIC_OR_DEPARTMENT_OTHER): Payer: Self-pay | Admitting: *Deleted

## 2015-09-23 ENCOUNTER — Emergency Department (HOSPITAL_BASED_OUTPATIENT_CLINIC_OR_DEPARTMENT_OTHER)
Admission: EM | Admit: 2015-09-23 | Discharge: 2015-09-23 | Disposition: A | Discharge status: Home / Self Care | Payer: PRIVATE HEALTH INSURANCE | Attending: Emergency Medicine | Admitting: Emergency Medicine

## 2015-09-23 ENCOUNTER — Emergency Department (HOSPITAL_BASED_OUTPATIENT_CLINIC_OR_DEPARTMENT_OTHER): Payer: PRIVATE HEALTH INSURANCE

## 2015-09-23 DIAGNOSIS — N760 Acute vaginitis: Secondary | ICD-10-CM | POA: Insufficient documentation

## 2015-09-23 DIAGNOSIS — Z8679 Personal history of other diseases of the circulatory system: Secondary | ICD-10-CM | POA: Diagnosis not present

## 2015-09-23 DIAGNOSIS — Z3202 Encounter for pregnancy test, result negative: Secondary | ICD-10-CM | POA: Diagnosis not present

## 2015-09-23 DIAGNOSIS — N730 Acute parametritis and pelvic cellulitis: Secondary | ICD-10-CM | POA: Insufficient documentation

## 2015-09-23 DIAGNOSIS — R1030 Lower abdominal pain, unspecified: Secondary | ICD-10-CM | POA: Diagnosis present

## 2015-09-23 DIAGNOSIS — Z87891 Personal history of nicotine dependence: Secondary | ICD-10-CM | POA: Insufficient documentation

## 2015-09-23 DIAGNOSIS — B9689 Other specified bacterial agents as the cause of diseases classified elsewhere: Secondary | ICD-10-CM

## 2015-09-23 LAB — CBC
HCT: 31.7 % — ABNORMAL LOW (ref 36.0–46.0)
Hemoglobin: 10.5 g/dL — ABNORMAL LOW (ref 12.0–15.0)
MCH: 29.6 pg (ref 26.0–34.0)
MCHC: 33.1 g/dL (ref 30.0–36.0)
MCV: 89.3 fL (ref 78.0–100.0)
Platelets: 296 10*3/uL (ref 150–400)
RBC: 3.55 MIL/uL — ABNORMAL LOW (ref 3.87–5.11)
RDW: 14.2 % (ref 11.5–15.5)
WBC: 8.4 10*3/uL (ref 4.0–10.5)

## 2015-09-23 LAB — PREGNANCY, URINE: PREG TEST UR: NEGATIVE

## 2015-09-23 LAB — URINALYSIS, ROUTINE W REFLEX MICROSCOPIC
BILIRUBIN URINE: NEGATIVE
Glucose, UA: NEGATIVE mg/dL
KETONES UR: NEGATIVE mg/dL
NITRITE: NEGATIVE
Protein, ur: NEGATIVE mg/dL
Specific Gravity, Urine: 1.027 (ref 1.005–1.030)
pH: 5.5 (ref 5.0–8.0)

## 2015-09-23 LAB — COMPREHENSIVE METABOLIC PANEL
ALT: 12 U/L — ABNORMAL LOW (ref 14–54)
AST: 15 U/L (ref 15–41)
Albumin: 4.3 g/dL (ref 3.5–5.0)
Alkaline Phosphatase: 48 U/L (ref 38–126)
Anion gap: 8 (ref 5–15)
BUN: 10 mg/dL (ref 6–20)
CO2: 26 mmol/L (ref 22–32)
CREATININE: 0.64 mg/dL (ref 0.44–1.00)
Calcium: 9.1 mg/dL (ref 8.9–10.3)
Chloride: 105 mmol/L (ref 101–111)
GFR calc Af Amer: >60 mL/min (ref >60)
GFR calc non Af Amer: >60 mL/min (ref >60)
Glucose, Bld: 97 mg/dL (ref 65–99)
Potassium: 3.6 mmol/L (ref 3.5–5.1)
SODIUM: 139 mmol/L (ref 135–145)
Total Bilirubin: 0.4 mg/dL (ref 0.3–1.2)
Total Protein: 7.2 g/dL (ref 6.5–8.1)

## 2015-09-23 LAB — WET PREP, GENITAL
Sperm: NONE SEEN
Trich, Wet Prep: NONE SEEN
Yeast Wet Prep HPF POC: NONE SEEN

## 2015-09-23 LAB — URINE MICROSCOPIC-ADD ON

## 2015-09-23 MED ORDER — DOXYCYCLINE HYCLATE 50 MG PO CAPS: 100.0000 mg | ORAL_CAPSULE | Freq: Two times a day (BID) | ORAL | Status: DC

## 2015-09-23 MED ORDER — IOPAMIDOL (ISOVUE-300) INJECTION 61%
100.0000 mL | Freq: Once | INTRAVENOUS | Status: AC | PRN
  Administered 2015-09-23: 100 mL via INTRAVENOUS

## 2015-09-23 MED ORDER — MORPHINE SULFATE (PF) 4 MG/ML IV SOLN
4.0000 mg | Freq: Once | INTRAVENOUS | Status: AC
  Administered 2015-09-23: 4 mg via INTRAVENOUS
  Filled 2015-09-23: qty 1

## 2015-09-23 MED ORDER — METRONIDAZOLE 500 MG PO TABS: 500.0000 mg | ORAL_TABLET | Freq: Two times a day (BID) | ORAL | Status: DC

## 2015-09-23 MED ORDER — CEFTRIAXONE SODIUM 250 MG IJ SOLR
250.0000 mg | Freq: Once | INTRAMUSCULAR | Status: AC
  Administered 2015-09-23: 250 mg via INTRAMUSCULAR
  Filled 2015-09-23: qty 250

## 2015-09-23 MED FILL — DOXYCYCLINE HYC 50 MG CAP: 50 | 7 days supply | Qty: 28 | Fill #0

## 2015-09-23 MED FILL — metroNIDAZOLE 500 MG TABS: 500 | 7 days supply | Qty: 14 | Fill #0
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
## 2015-09-23 NOTE — Discharge Instructions (Signed)
You were diagnosed with PID. If you have fevers, worsening abdominal pain, nausea, vomiting for return to emergency department for evaluation. You being treated for pelvic inflammatory disease. Please take the antibiotics as prescribed. Please follow with your primary care provider within the next 2- 3 days.

## 2015-09-23 NOTE — ED Provider Notes (Signed)
Patient seen/examined in the Emergency Department in conjunction with Resident Physician Provider Haney Patient reports diffuse abd pain Exam : awake/alert, diffuse abd tenderness.   Plan: proceed to CT imaging.  Pt stable at this time   Zadie Rhineonald Tevon Berhane, MD 09/23/15 [phone]

## 2015-09-23 NOTE — ED Notes (Signed)
C/o lower right and left abd pain. No vag discharge, or n/v or urinary sx. Onset 2 days ago.

## 2015-09-23 NOTE — ED Notes (Signed)
Pt called boyfriend and he will pick her up at discharge.

## 2015-09-24 LAB — GC/CHLAMYDIA PROBE AMP (~~LOC~~) NOT AT ARMC
Chlamydia: NEGATIVE
NEISSERIA GONORRHEA: NEGATIVE

## 2016-03-12 ENCOUNTER — Emergency Department (HOSPITAL_BASED_OUTPATIENT_CLINIC_OR_DEPARTMENT_OTHER)
Admission: EM | Admit: 2016-03-12 | Discharge: 2016-03-12 | Disposition: A | Discharge status: Home / Self Care | Payer: Medicaid Other | Attending: Dermatology | Admitting: Dermatology

## 2016-03-12 ENCOUNTER — Encounter (HOSPITAL_BASED_OUTPATIENT_CLINIC_OR_DEPARTMENT_OTHER): Payer: Self-pay | Admitting: Emergency Medicine

## 2016-03-12 DIAGNOSIS — S6991XA Unspecified injury of right wrist, hand and finger(s), initial encounter: Secondary | ICD-10-CM | POA: Insufficient documentation

## 2016-03-12 DIAGNOSIS — Y9389 Activity, other specified: Secondary | ICD-10-CM | POA: Insufficient documentation

## 2016-03-12 DIAGNOSIS — Z87891 Personal history of nicotine dependence: Secondary | ICD-10-CM | POA: Diagnosis not present

## 2016-03-12 DIAGNOSIS — Y999 Unspecified external cause status: Secondary | ICD-10-CM | POA: Insufficient documentation

## 2016-03-12 DIAGNOSIS — Y929 Unspecified place or not applicable: Secondary | ICD-10-CM | POA: Diagnosis not present

## 2016-03-12 DIAGNOSIS — Z5321 Procedure and treatment not carried out due to patient leaving prior to being seen by health care provider: Secondary | ICD-10-CM | POA: Insufficient documentation

## 2016-03-12 NOTE — ED Notes (Signed)
Pt called from triage x3 for room assignment, pt not found in lobby or surrounding.  Pt LWBS.
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
## 2016-03-12 NOTE — ED Notes (Signed)
Pt called by triage RN for room assignment.  Pt not found in lobby or surrounding.

## 2016-03-12 NOTE — ED Notes (Signed)
Called x1 for room no answer 

## 2017-02-12 ENCOUNTER — Emergency Department (HOSPITAL_BASED_OUTPATIENT_CLINIC_OR_DEPARTMENT_OTHER): Payer: PRIVATE HEALTH INSURANCE

## 2017-02-12 ENCOUNTER — Encounter (HOSPITAL_BASED_OUTPATIENT_CLINIC_OR_DEPARTMENT_OTHER): Payer: Self-pay | Admitting: Emergency Medicine

## 2017-02-12 ENCOUNTER — Emergency Department (HOSPITAL_BASED_OUTPATIENT_CLINIC_OR_DEPARTMENT_OTHER)
Admission: EM | Admit: 2017-02-12 | Discharge: 2017-02-12 | Disposition: A | Discharge status: Home / Self Care | Payer: PRIVATE HEALTH INSURANCE | Attending: Emergency Medicine | Admitting: Emergency Medicine

## 2017-02-12 DIAGNOSIS — N83201 Unspecified ovarian cyst, right side: Secondary | ICD-10-CM

## 2017-02-12 DIAGNOSIS — R1032 Left lower quadrant pain: Secondary | ICD-10-CM | POA: Insufficient documentation

## 2017-02-12 DIAGNOSIS — N83291 Other ovarian cyst, right side: Secondary | ICD-10-CM | POA: Diagnosis not present

## 2017-02-12 DIAGNOSIS — R300 Dysuria: Secondary | ICD-10-CM | POA: Diagnosis present

## 2017-02-12 DIAGNOSIS — Z87891 Personal history of nicotine dependence: Secondary | ICD-10-CM | POA: Insufficient documentation

## 2017-02-12 LAB — COMPREHENSIVE METABOLIC PANEL
ALBUMIN: 4.1 g/dL (ref 3.5–5.0)
ALK PHOS: 47 U/L (ref 38–126)
ALT: 13 U/L — AB (ref 14–54)
AST: 16 U/L (ref 15–41)
Anion gap: 8 (ref 5–15)
BILIRUBIN TOTAL: 0.2 mg/dL — AB (ref 0.3–1.2)
BUN: 8 mg/dL (ref 6–20)
CALCIUM: 9.1 mg/dL (ref 8.9–10.3)
CO2: 26 mmol/L (ref 22–32)
CREATININE: 0.57 mg/dL (ref 0.44–1.00)
Chloride: 103 mmol/L (ref 101–111)
GFR calc Af Amer: >60 mL/min (ref >60)
GLUCOSE: 99 mg/dL (ref 65–99)
Potassium: 3.7 mmol/L (ref 3.5–5.1)
Sodium: 137 mmol/L (ref 135–145)
TOTAL PROTEIN: 6.7 g/dL (ref 6.5–8.1)

## 2017-02-12 LAB — URINALYSIS, MICROSCOPIC (REFLEX): WBC UA: NONE SEEN WBC/hpf (ref 0–5)

## 2017-02-12 LAB — CBC WITH DIFFERENTIAL/PLATELET
BASOS ABS: 0 10*3/uL (ref 0.0–0.1)
BASOS PCT: 0 %
Eosinophils Absolute: 0.2 10*3/uL (ref 0.0–0.7)
Eosinophils Relative: 2 %
HEMATOCRIT: 32.3 % — AB (ref 36.0–46.0)
HEMOGLOBIN: 10.7 g/dL — AB (ref 12.0–15.0)
LYMPHS PCT: 25 %
Lymphs Abs: 2.1 10*3/uL (ref 0.7–4.0)
MCH: 29 pg (ref 26.0–34.0)
MCHC: 33.1 g/dL (ref 30.0–36.0)
MCV: 87.5 fL (ref 78.0–100.0)
MONOS PCT: 6 %
Monocytes Absolute: 0.5 10*3/uL (ref 0.1–1.0)
NEUTROS ABS: 5.6 10*3/uL (ref 1.7–7.7)
Neutrophils Relative %: 67 %
Platelets: 276 10*3/uL (ref 150–400)
RBC: 3.69 MIL/uL — ABNORMAL LOW (ref 3.87–5.11)
RDW: 13.9 % (ref 11.5–15.5)
WBC: 8.3 10*3/uL (ref 4.0–10.5)

## 2017-02-12 LAB — URINALYSIS, ROUTINE W REFLEX MICROSCOPIC
Bilirubin Urine: NEGATIVE
GLUCOSE, UA: NEGATIVE mg/dL
Ketones, ur: NEGATIVE mg/dL
LEUKOCYTES UA: NEGATIVE
Nitrite: NEGATIVE
PH: 8.5 — AB (ref 5.0–8.0)
Protein, ur: NEGATIVE mg/dL
Specific Gravity, Urine: 1.016 (ref 1.005–1.030)

## 2017-02-12 LAB — PREGNANCY, URINE: Preg Test, Ur: NEGATIVE

## 2017-02-12 LAB — LIPASE, BLOOD: Lipase: 22 U/L (ref 11–51)

## 2017-02-12 MED ORDER — ONDANSETRON HCL 4 MG/2ML IJ SOLN
4.0000 mg | Freq: Once | INTRAMUSCULAR | Status: AC
  Administered 2017-02-12: 4 mg via INTRAVENOUS
  Filled 2017-02-12: qty 2

## 2017-02-12 MED ORDER — NAPROXEN 375 MG PO TABS: 375.0000 mg | ORAL_TABLET | Freq: Two times a day (BID) | ORAL | 0 refills | Status: DC | PRN

## 2017-02-12 MED ORDER — FENTANYL CITRATE (PF) 100 MCG/2ML IJ SOLN
100.0000 ug | Freq: Once | INTRAMUSCULAR | Status: AC | PRN
  Administered 2017-02-12: 100 ug via INTRAVENOUS
  Filled 2017-02-12: qty 2

## 2017-02-12 MED ORDER — TRAMADOL HCL 50 MG PO TABS: 50.0000 mg | ORAL_TABLET | Freq: Four times a day (QID) | ORAL | 0 refills | Status: DC | PRN

## 2017-02-12 NOTE — ED Triage Notes (Signed)
Patient states that she is having frequent urination, and unable to fully empty her bladder for the last 8 days. Reports that she is nauseated only  - pain is the worst today. Pain to her right pelvic region
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
## 2017-02-12 NOTE — Discharge Instructions (Signed)
Get help right away if: You have abdominal pain that is severe or gets worse. You cannot eat or drink without vomiting. You suddenly develop a fever. Your menstrual period is much heavier than usual 

## 2017-02-12 NOTE — ED Notes (Signed)
Patient transported to Ultrasound 

## 2017-02-13 LAB — GC/CHLAMYDIA PROBE AMP (~~LOC~~) NOT AT ARMC
Chlamydia: NEGATIVE
Neisseria Gonorrhea: NEGATIVE

## 2017-02-14 LAB — RPR: RPR: NONREACTIVE

## 2017-02-14 LAB — HIV ANTIBODY (ROUTINE TESTING W REFLEX): HIV Screen 4th Generation wRfx: NONREACTIVE

## 2018-06-25 IMAGING — US US ART/VEN ABD/PELV/SCROTUM DOPPLER LTD
1 series · 13 of 25 positions shown · non-contrast
Comparison: 09/23/2015 CT abdomen/ pelvis. 08/05/2010 pelvic
sonogram.

CLINICAL DATA: 31-year-old female with right lower quadrant pain
for 1 week and urinary frequency for 2 weeks. LMP 02/02/2017.

EXAM:
TRANSABDOMINAL AND TRANSVAGINAL ULTRASOUND OF PELVIS
DOPPLER ULTRASOUND OF OVARIES
TECHNIQUE: Both transabdominal and transvaginal ultrasound examinations of the
pelvis were performed. Transabdominal technique was performed for
global imaging of the pelvis including uterus, ovaries, adnexal
regions, and pelvic cul-de-sac.
It was necessary to proceed with endovaginal exam following the
transabdominal exam to visualize the endometrium and adnexa. Color
and duplex Doppler ultrasound was utilized to evaluate blood flow to
the ovaries.

[Series 1: us art/ven abd/pelv/scrotum doppler ltd · 0.18mm/px · 13 of 74 slices shown]
[im 1/74]
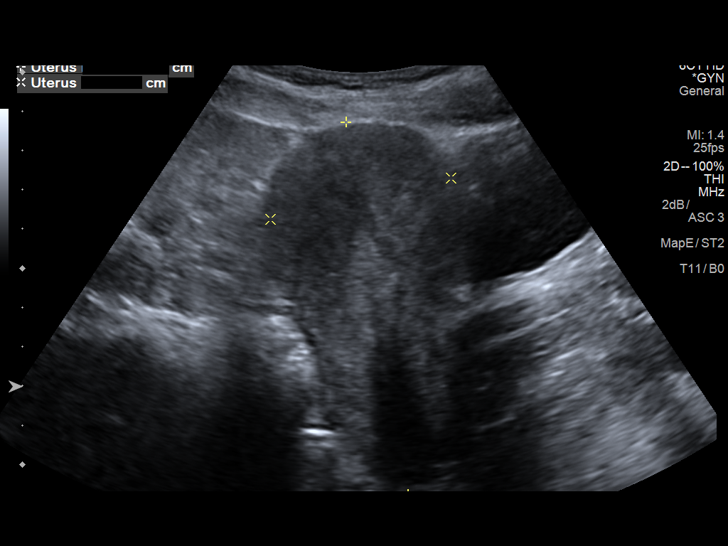
[im 7/74]
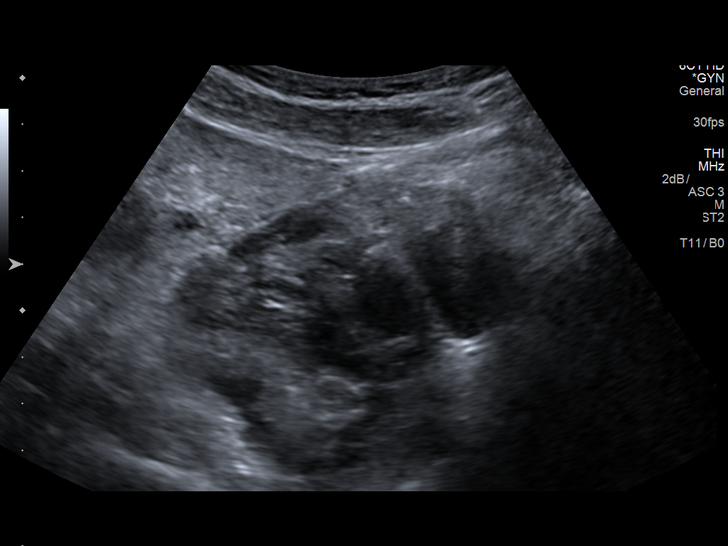
[im 13/74]
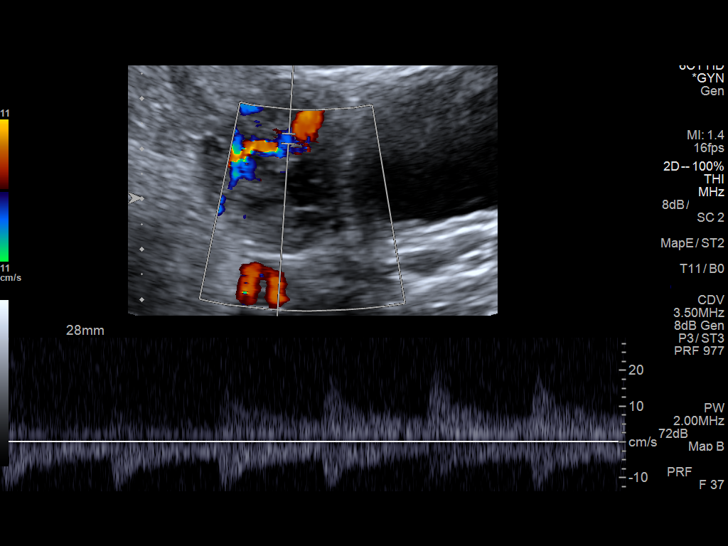
[im 19/74]
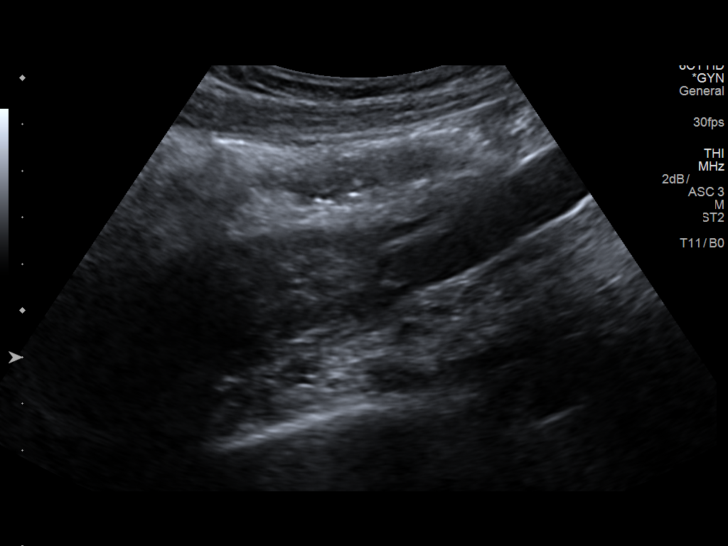
[im 25/74]
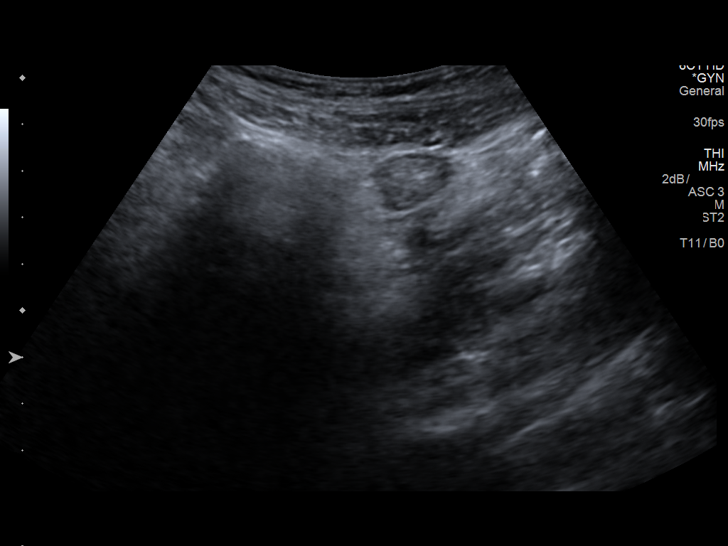
[im 31/74]
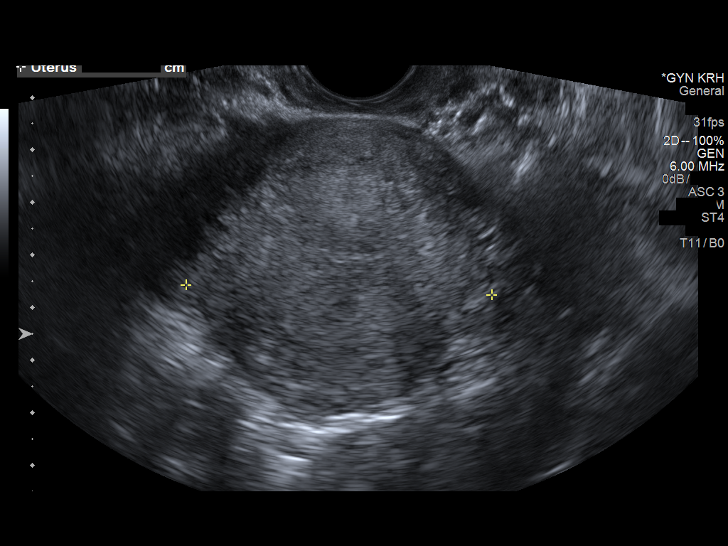
[im 37/74]
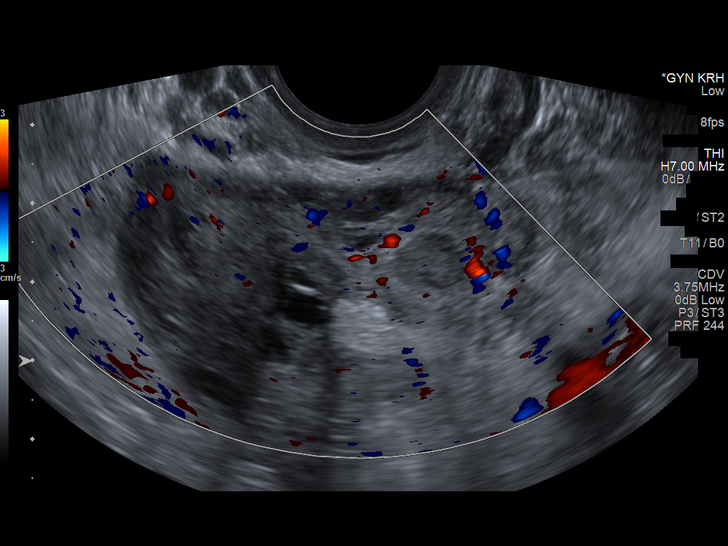
[im 43/74]
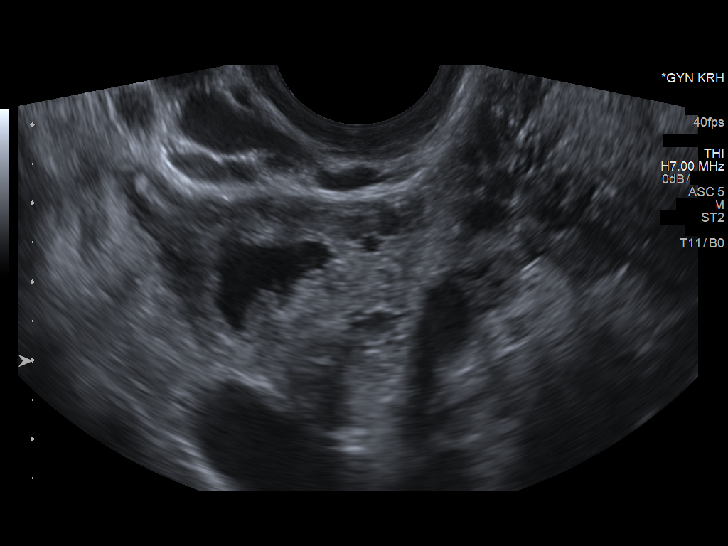
[im 49/74]
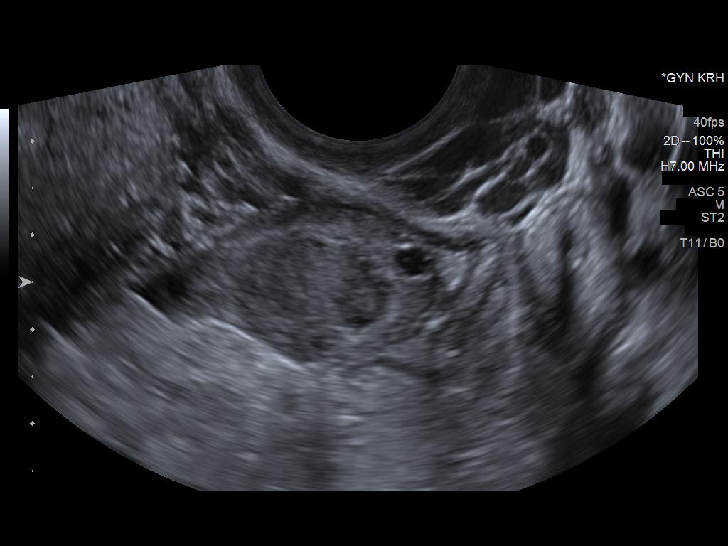
[im 55/74]
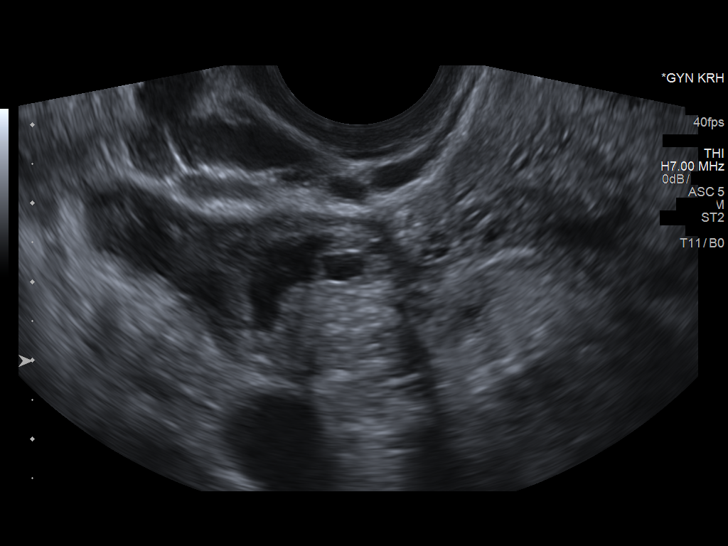
[im 61/74]
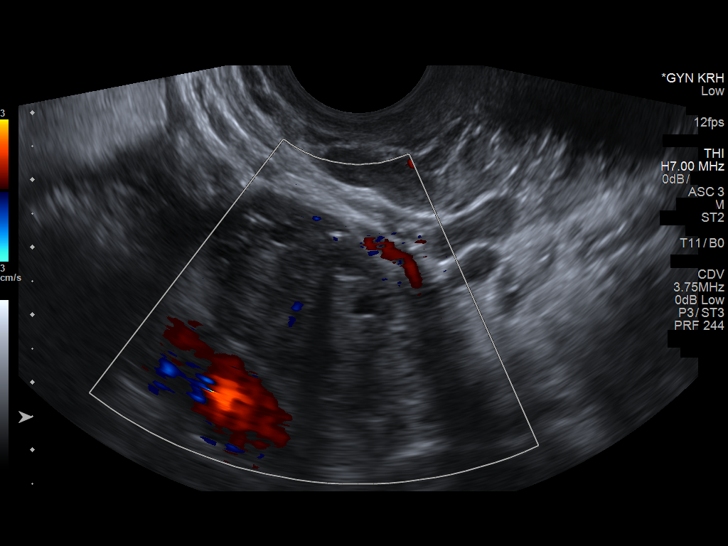
[im 67/74]
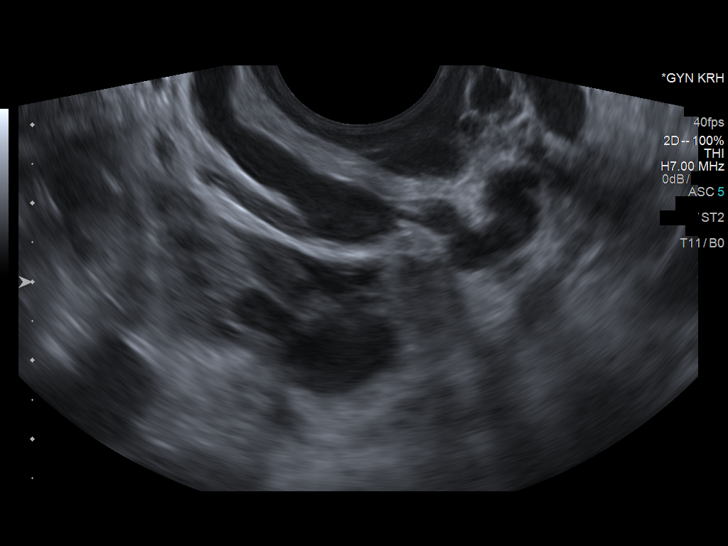
[im 74/74]
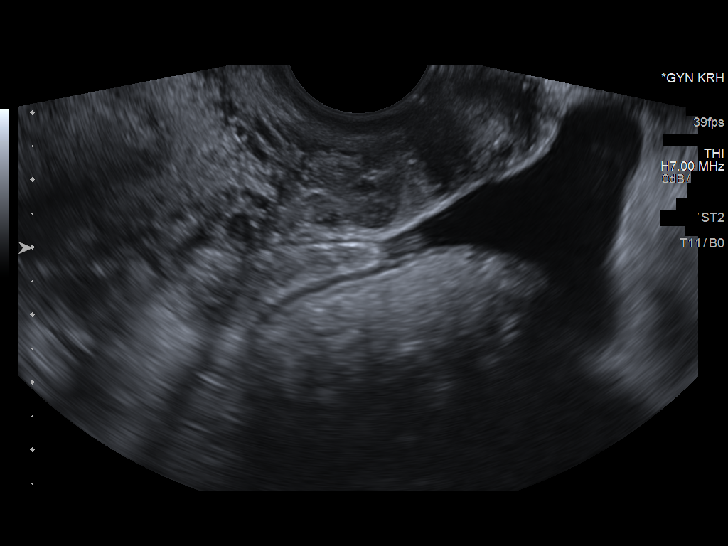

[13 of 25 positions shown; findings below may reference images not displayed]

FINDINGS: Uterus

Measurements: 9.6 x 4.8 x 5.8 cm. Anteverted uterus appears normal
in size and configuration, with no uterine fibroids or other
myometrial abnormalities.

Endometrium

Thickness: 5 mm. No endometrial cavity fluid or focal endometrial
mass.

Right ovary

Measurements: 4.9 x 2.9 x 3.0 cm. There is an involuting 1.9 x 1.1 x
1.8 cm right ovarian corpus luteum. There is a separate 1.7 x 1.4 x
1.6 cm right ovarian hemorrhagic cyst versus endometrioma. No
additional right ovarian or right adnexal masses.

Left ovary

Measurements: 3.7 x 2.4 x 2.4 cm. Normal appearance/no adnexal mass.

Pulsed Doppler evaluation of both ovaries demonstrates normal
low-resistance arterial and venous waveforms.

Other findings

Small volume simple free fluid in the pelvic cul-de-sac.
IMPRESSION: 1. No evidence of adnexal torsion.
2. Small 1.7 cm right ovarian hemorrhagic cyst versus endometrioma.
Follow-up transvaginal pelvic ultrasound in 6-12 weeks advised. This
recommendation follows the consensus statement: Management of
Asymptomatic Ovarian and Other Adnexal Cysts Imaged at US: Society
of Radiologists in Ultrasound Consensus Conference Statement.
3. Normal anteverted uterus.

## 2019-06-04 ENCOUNTER — Encounter (HOSPITAL_BASED_OUTPATIENT_CLINIC_OR_DEPARTMENT_OTHER): Payer: Self-pay

## 2019-06-04 ENCOUNTER — Other Ambulatory Visit: Payer: Self-pay

## 2019-06-04 ENCOUNTER — Emergency Department (HOSPITAL_BASED_OUTPATIENT_CLINIC_OR_DEPARTMENT_OTHER)
Admission: EM | Admit: 2019-06-04 | Discharge: 2019-06-04 | Disposition: A | Discharge status: Home / Self Care | Payer: Medicaid Other | Attending: Emergency Medicine | Admitting: Emergency Medicine

## 2019-06-04 DIAGNOSIS — M67921 Unspecified disorder of synovium and tendon, right upper arm: Secondary | ICD-10-CM

## 2019-06-04 DIAGNOSIS — M7521 Bicipital tendinitis, right shoulder: Secondary | ICD-10-CM | POA: Insufficient documentation

## 2019-06-04 DIAGNOSIS — M25511 Pain in right shoulder: Secondary | ICD-10-CM | POA: Diagnosis present

## 2019-06-04 DIAGNOSIS — Z87891 Personal history of nicotine dependence: Secondary | ICD-10-CM | POA: Diagnosis not present
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
## 2019-06-04 NOTE — Discharge Instructions (Signed)
Take 4 over the counter ibuprofen tablets 3 times a day or 2 over-the-counter naproxen tablets twice a day for pain. Also take tylenol 1000mg(2 extra strength) four times a day.    
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
## 2019-06-06 ENCOUNTER — Encounter (HOSPITAL_COMMUNITY): Payer: Self-pay | Admitting: Emergency Medicine

## 2019-06-06 ENCOUNTER — Other Ambulatory Visit: Payer: Self-pay

## 2019-06-06 ENCOUNTER — Emergency Department (HOSPITAL_COMMUNITY)
Admission: EM | Admit: 2019-06-06 | Discharge: 2019-06-06 | Disposition: A | Discharge status: Home / Self Care | Payer: Medicaid Other | Attending: Emergency Medicine | Admitting: Emergency Medicine

## 2019-06-06 DIAGNOSIS — Z87891 Personal history of nicotine dependence: Secondary | ICD-10-CM | POA: Insufficient documentation

## 2019-06-06 DIAGNOSIS — G44209 Tension-type headache, unspecified, not intractable: Secondary | ICD-10-CM | POA: Diagnosis not present

## 2019-06-06 DIAGNOSIS — M25511 Pain in right shoulder: Secondary | ICD-10-CM | POA: Insufficient documentation

## 2019-06-06 DIAGNOSIS — R519 Headache, unspecified: Secondary | ICD-10-CM | POA: Diagnosis present

## 2019-06-06 DIAGNOSIS — M791 Myalgia, unspecified site: Secondary | ICD-10-CM | POA: Diagnosis not present

## 2019-06-06 MED ORDER — DIAZEPAM 5 MG PO TABS: 2.5000 mg | ORAL_TABLET | Freq: Two times a day (BID) | ORAL | 0 refills | Status: AC

## 2019-06-06 MED ORDER — IBUPROFEN 600 MG PO TABS: 600.0000 mg | ORAL_TABLET | Freq: Four times a day (QID) | ORAL | 0 refills | Status: AC | PRN
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
## 2019-06-06 NOTE — Discharge Instructions (Signed)
I prescribed a very strong muscle relaxer called Valium.  You should only try to take this at night, as it will make you extremely drowsy.  You should not drive a car, swim, or operate heavy machinery after taking this.  You can start by taking half a tablet (2.5 mg) at bedtime.   If this is not strong enough, you can try to take a full tablet (5 mg) the next night.

## 2020-07-22 DIAGNOSIS — R946 Abnormal results of thyroid function studies: Secondary | ICD-10-CM | POA: Diagnosis not present

## 2020-07-22 DIAGNOSIS — Z0000 Encounter for general adult medical examination without abnormal findings: Secondary | ICD-10-CM | POA: Diagnosis not present

## 2020-07-22 DIAGNOSIS — R7309 Other abnormal glucose: Secondary | ICD-10-CM | POA: Diagnosis not present

## 2020-08-03 DIAGNOSIS — R946 Abnormal results of thyroid function studies: Secondary | ICD-10-CM | POA: Diagnosis not present

## 2020-08-03 DIAGNOSIS — Z0000 Encounter for general adult medical examination without abnormal findings: Secondary | ICD-10-CM | POA: Diagnosis not present

## 2020-08-03 DIAGNOSIS — R7309 Other abnormal glucose: Secondary | ICD-10-CM | POA: Diagnosis not present

## 2021-04-14 DIAGNOSIS — N926 Irregular menstruation, unspecified: Secondary | ICD-10-CM | POA: Diagnosis not present

## 2021-04-14 DIAGNOSIS — K5900 Constipation, unspecified: Secondary | ICD-10-CM | POA: Diagnosis not present

## 2021-04-14 DIAGNOSIS — R110 Nausea: Secondary | ICD-10-CM | POA: Diagnosis not present

## 2021-04-21 DIAGNOSIS — N911 Secondary amenorrhea: Secondary | ICD-10-CM | POA: Diagnosis not present

## 2021-05-04 DIAGNOSIS — Z3685 Encounter for antenatal screening for Streptococcus B: Secondary | ICD-10-CM | POA: Diagnosis not present

## 2021-05-04 DIAGNOSIS — Z3481 Encounter for supervision of other normal pregnancy, first trimester: Secondary | ICD-10-CM | POA: Diagnosis not present

## 2021-05-04 LAB — OB RESULTS CONSOLE ANTIBODY SCREEN: Antibody Screen: NEGATIVE

## 2021-05-04 LAB — OB RESULTS CONSOLE RPR: RPR: NONREACTIVE

## 2021-05-04 LAB — HEPATITIS C ANTIBODY: HCV Ab: NEGATIVE

## 2021-05-04 LAB — OB RESULTS CONSOLE ABO/RH: RH Type: POSITIVE

## 2021-05-04 LAB — OB RESULTS CONSOLE HEPATITIS B SURFACE ANTIGEN: Hepatitis B Surface Ag: NEGATIVE

## 2021-05-04 LAB — OB RESULTS CONSOLE HIV ANTIBODY (ROUTINE TESTING): HIV: NONREACTIVE

## 2021-05-04 LAB — OB RESULTS CONSOLE RUBELLA ANTIBODY, IGM: Rubella: IMMUNE

## 2021-05-17 DIAGNOSIS — Z124 Encounter for screening for malignant neoplasm of cervix: Secondary | ICD-10-CM | POA: Diagnosis not present

## 2021-05-17 DIAGNOSIS — Z3400 Encounter for supervision of normal first pregnancy, unspecified trimester: Secondary | ICD-10-CM | POA: Diagnosis not present

## 2021-05-17 DIAGNOSIS — Z113 Encounter for screening for infections with a predominantly sexual mode of transmission: Secondary | ICD-10-CM | POA: Diagnosis not present

## 2021-05-18 LAB — OB RESULTS CONSOLE GC/CHLAMYDIA
Chlamydia: NEGATIVE
Neisseria Gonorrhea: NEGATIVE

## 2021-06-02 DIAGNOSIS — Z3A13 13 weeks gestation of pregnancy: Secondary | ICD-10-CM | POA: Diagnosis not present

## 2021-06-02 DIAGNOSIS — Z3682 Encounter for antenatal screening for nuchal translucency: Secondary | ICD-10-CM | POA: Diagnosis not present

## 2021-06-27 NOTE — L&D Delivery Note (Signed)
Delivery Note  SVD viable female Apgars 9,9 over intact perineum.  Placenta delivered spontaneously intact with 3VC.   R/V exam confirms. .   Mother and baby to couplet care and are doing well.  EBL 100cc  Candice Camp, MD

## 2021-07-14 DIAGNOSIS — Z363 Encounter for antenatal screening for malformations: Secondary | ICD-10-CM | POA: Diagnosis not present

## 2021-07-14 DIAGNOSIS — Z1371 Encounter for nonprocreative screening for genetic disease carrier status: Secondary | ICD-10-CM | POA: Diagnosis not present

## 2021-07-14 DIAGNOSIS — Z3A19 19 weeks gestation of pregnancy: Secondary | ICD-10-CM | POA: Diagnosis not present

## 2021-09-09 DIAGNOSIS — Z3480 Encounter for supervision of other normal pregnancy, unspecified trimester: Secondary | ICD-10-CM | POA: Diagnosis not present

## 2021-10-21 DIAGNOSIS — O99019 Anemia complicating pregnancy, unspecified trimester: Secondary | ICD-10-CM | POA: Diagnosis not present

## 2021-11-10 DIAGNOSIS — Z3682 Encounter for antenatal screening for nuchal translucency: Secondary | ICD-10-CM | POA: Diagnosis not present

## 2021-11-10 LAB — OB RESULTS CONSOLE GBS: GBS: NEGATIVE

## 2021-11-27 ENCOUNTER — Inpatient Hospital Stay (HOSPITAL_COMMUNITY)
Admission: AD | Admit: 2021-11-27 | Discharge: 2021-11-27 | Disposition: A | Discharge status: Home / Self Care | Payer: Federal, State, Local not specified - PPO | Attending: Obstetrics & Gynecology | Admitting: Obstetrics & Gynecology

## 2021-11-27 ENCOUNTER — Encounter (HOSPITAL_COMMUNITY): Payer: Self-pay | Admitting: Obstetrics & Gynecology

## 2021-11-27 DIAGNOSIS — Z3A38 38 weeks gestation of pregnancy: Secondary | ICD-10-CM | POA: Diagnosis not present

## 2021-11-27 DIAGNOSIS — O479 False labor, unspecified: Secondary | ICD-10-CM

## 2021-11-27 DIAGNOSIS — O471 False labor at or after 37 completed weeks of gestation: Secondary | ICD-10-CM | POA: Insufficient documentation

## 2021-11-27 DIAGNOSIS — Z3689 Encounter for other specified antenatal screening: Secondary | ICD-10-CM

## 2021-11-27 NOTE — MAU Provider Note (Signed)
S: Ms. AHAVA KISSOON is a 36 y.o. [id number] at [redacted]w[redacted]d  who presents to MAU today complaining contractions q 5-6. Her contractions initially started on Thursday 6/1 but they started getting more intense tonight around 2300. She denies vaginal bleeding. She denies LOF. She reports normal fetal movement. She has no other concerns at this time.   O: BP 126/75   Pulse 78   Temp 98.1 F (36.7 C) (Oral)   Resp 18   Ht 5\' 8"  (1.727 m)   Wt 106.4 kg   SpO2 98%   BMI 35.66 kg/m   GENERAL: Well-developed, well-nourished female in no acute distress.  HEAD: Normocephalic, atraumatic.  CHEST: Normal effort of breathing, normal heart rate. ABDOMEN: Soft, nontender, gravid.  Cervical exam:  Dilation: 3 Effacement (%): Thick Station: -3 Presentation: Vertex Exam by:: [email or web address], RN   Fetal Monitoring: Baseline: 145 bpm Variability: Moderate Accelerations: Present  Decelerations: Absent  Contractions: Occasional contractions with uterine irritability   A: SIUP at [redacted]w[redacted]d  False labor  P: SVE unchanged on recheck by RN Reactive NST  Stable for discharge with labor precautions All questions and concerns addressed   [redacted]w[redacted]d, MD 11/27/2021 4:13 AM

## 2021-11-27 NOTE — MAU Note (Signed)
.  [name] is a 36 y.o. at [redacted]w[redacted]d here in MAU reporting: CTX and pelvis pressure like a bowel movement since yesterday around 2300, with pain in her lower back. Pt reports ctx every 5-6 mins. Pt denies VB, LOF, DFM, abnormal discharge, PIH s/s, recent intercourse, and complication in the pregnancy.  SVE on last Weds 3 cm GBS neg  Onset of complaint: 2300 yesterday  Pain score: 10/10 Vitals:   11/27/21 0141  BP: 134/67  Pulse: 78  Resp: 18  Temp: 98.1 F (36.7 C)  SpO2: 100%     FHT:140 Lab orders placed from triage:  none

## 2021-11-27 NOTE — Progress Notes (Signed)
Written and verbal discharge instructions given to pt. Pt verbalized understanding.  

## 2021-12-02 ENCOUNTER — Telehealth (HOSPITAL_COMMUNITY): Payer: Self-pay | Admitting: *Deleted

## 2021-12-02 ENCOUNTER — Encounter (HOSPITAL_COMMUNITY): Payer: Self-pay | Admitting: *Deleted

## 2021-12-02 NOTE — Telephone Encounter (Signed)
Preadmission screen  

## 2021-12-04 ENCOUNTER — Inpatient Hospital Stay (HOSPITAL_COMMUNITY): Payer: Federal, State, Local not specified - PPO | Admitting: Anesthesiology

## 2021-12-04 ENCOUNTER — Encounter (HOSPITAL_COMMUNITY): Payer: Self-pay | Admitting: Obstetrics and Gynecology

## 2021-12-04 ENCOUNTER — Inpatient Hospital Stay (HOSPITAL_COMMUNITY)
Admission: AD | Admit: 2021-12-04 | Discharge: 2021-12-05 | DRG: 807 | Disposition: A | Discharge status: Home / Self Care | Payer: Federal, State, Local not specified - PPO | Attending: Obstetrics and Gynecology | Admitting: Obstetrics and Gynecology

## 2021-12-04 DIAGNOSIS — D649 Anemia, unspecified: Secondary | ICD-10-CM | POA: Diagnosis not present

## 2021-12-04 DIAGNOSIS — Z23 Encounter for immunization: Secondary | ICD-10-CM | POA: Diagnosis not present

## 2021-12-04 DIAGNOSIS — O9902 Anemia complicating childbirth: Secondary | ICD-10-CM | POA: Diagnosis not present

## 2021-12-04 DIAGNOSIS — Z87891 Personal history of nicotine dependence: Secondary | ICD-10-CM

## 2021-12-04 DIAGNOSIS — Z3A39 39 weeks gestation of pregnancy: Secondary | ICD-10-CM

## 2021-12-04 DIAGNOSIS — O26893 Other specified pregnancy related conditions, third trimester: Secondary | ICD-10-CM | POA: Diagnosis not present

## 2021-12-04 LAB — CBC
HCT: 33.3 % — ABNORMAL LOW (ref 36.0–46.0)
Hemoglobin: 10.7 g/dL — ABNORMAL LOW (ref 12.0–15.0)
MCH: 29.6 pg (ref 26.0–34.0)
MCHC: 32.1 g/dL (ref 30.0–36.0)
MCV: 92.2 fL (ref 80.0–100.0)
Platelets: 225 10*3/uL (ref 150–400)
RBC: 3.61 MIL/uL — ABNORMAL LOW (ref 3.87–5.11)
RDW: 16.2 % — ABNORMAL HIGH (ref 11.5–15.5)
WBC: 13.9 10*3/uL — ABNORMAL HIGH (ref 4.0–10.5)
nRBC: 0 % (ref 0.0–0.2)

## 2021-12-04 LAB — TYPE AND SCREEN
ABO/RH(D): O POS
Antibody Screen: NEGATIVE

## 2021-12-04 LAB — RPR: RPR Ser Ql: NONREACTIVE

## 2021-12-04 MED ORDER — MEASLES, MUMPS & RUBELLA VAC IJ SOLR: 0.5000 mL | Freq: Once | INTRAMUSCULAR | Status: DC

## 2021-12-04 MED ORDER — PRENATAL MULTIVITAMIN CH
1.0000 | ORAL_TABLET | Freq: Every day | ORAL | Status: DC
  Administered 2021-12-04 – 2021-12-05 (×2): 1 via ORAL
  Filled 2021-12-04 (×2): qty 1

## 2021-12-04 MED ORDER — DIBUCAINE (PERIANAL) 1 % EX OINT: 1.0000 "application " | TOPICAL_OINTMENT | CUTANEOUS | Status: DC | PRN

## 2021-12-04 MED ORDER — LACTATED RINGERS IV SOLN
500.0000 mL | Freq: Once | INTRAVENOUS | Status: AC
  Administered 2021-12-04: 500 mL via INTRAVENOUS

## 2021-12-04 MED ORDER — ONDANSETRON HCL 4 MG PO TABS: 4.0000 mg | ORAL_TABLET | ORAL | Status: DC | PRN

## 2021-12-04 MED ORDER — LACTATED RINGERS IV SOLN: 500.0000 mL | INTRAVENOUS | Status: DC | PRN

## 2021-12-04 MED ORDER — IBUPROFEN 600 MG PO TABS
600.0000 mg | ORAL_TABLET | Freq: Four times a day (QID) | ORAL | Status: DC
  Administered 2021-12-04 – 2021-12-05 (×5): 600 mg via ORAL
  Filled 2021-12-04 (×5): qty 1

## 2021-12-04 MED ORDER — OXYCODONE-ACETAMINOPHEN 5-325 MG PO TABS: 2.0000 | ORAL_TABLET | ORAL | Status: DC | PRN

## 2021-12-04 MED ORDER — ONDANSETRON HCL 4 MG/2ML IJ SOLN: 4.0000 mg | INTRAMUSCULAR | Status: DC | PRN

## 2021-12-04 MED ORDER — EPHEDRINE 5 MG/ML INJ: 10.0000 mg | INTRAVENOUS | Status: DC | PRN

## 2021-12-04 MED ORDER — LACTATED RINGERS IV SOLN: INTRAVENOUS | Status: DC

## 2021-12-04 MED ORDER — ZOLPIDEM TARTRATE 5 MG PO TABS: 5.0000 mg | ORAL_TABLET | Freq: Every evening | ORAL | Status: DC | PRN

## 2021-12-04 MED ORDER — FLEET ENEMA 7-19 GM/118ML RE ENEM: 1.0000 | ENEMA | RECTAL | Status: DC | PRN

## 2021-12-04 MED ORDER — PHENYLEPHRINE 80 MCG/ML (10ML) SYRINGE FOR IV PUSH (FOR BLOOD PRESSURE SUPPORT): 80.0000 ug | PREFILLED_SYRINGE | INTRAVENOUS | Status: DC | PRN

## 2021-12-04 MED ORDER — ONDANSETRON HCL 4 MG/2ML IJ SOLN
4.0000 mg | Freq: Four times a day (QID) | INTRAMUSCULAR | Status: DC | PRN
  Administered 2021-12-04: 4 mg via INTRAVENOUS
  Filled 2021-12-04: qty 2

## 2021-12-04 MED ORDER — OXYTOCIN BOLUS FROM INFUSION
333.0000 mL | Freq: Once | INTRAVENOUS | Status: AC
  Administered 2021-12-04: 333 mL via INTRAVENOUS

## 2021-12-04 MED ORDER — DIPHENHYDRAMINE HCL 50 MG/ML IJ SOLN: 12.5000 mg | INTRAMUSCULAR | Status: DC | PRN

## 2021-12-04 MED ORDER — ACETAMINOPHEN 325 MG PO TABS: 650.0000 mg | ORAL_TABLET | ORAL | Status: DC | PRN

## 2021-12-04 MED ORDER — DIPHENHYDRAMINE HCL 25 MG PO CAPS: 25.0000 mg | ORAL_CAPSULE | Freq: Four times a day (QID) | ORAL | Status: DC | PRN

## 2021-12-04 MED ORDER — OXYTOCIN-SODIUM CHLORIDE 30-0.9 UT/500ML-% IV SOLN
2.5000 [IU]/h | INTRAVENOUS | Status: DC
  Administered 2021-12-04: 2.5 [IU]/h via INTRAVENOUS

## 2021-12-04 MED ORDER — SIMETHICONE 80 MG PO CHEW: 80.0000 mg | CHEWABLE_TABLET | ORAL | Status: DC | PRN

## 2021-12-04 MED ORDER — FENTANYL-BUPIVACAINE-NACL 0.5-0.125-0.9 MG/250ML-% EP SOLN
12.0000 mL/h | EPIDURAL | Status: DC | PRN
  Administered 2021-12-04: 12 mL/h via EPIDURAL
  Filled 2021-12-04: qty 250

## 2021-12-04 MED ORDER — WITCH HAZEL-GLYCERIN EX PADS: 1.0000 "application " | MEDICATED_PAD | CUTANEOUS | Status: DC | PRN

## 2021-12-04 MED ORDER — COCONUT OIL OIL
1.0000 | TOPICAL_OIL | Status: DC | PRN
Start: 2021-12-04 — End: 2021-12-05
  Administered 2021-12-05: 1 via TOPICAL

## 2021-12-04 MED ORDER — MEDROXYPROGESTERONE ACETATE 150 MG/ML IM SUSP: 150.0000 mg | INTRAMUSCULAR | Status: DC | PRN

## 2021-12-04 MED ORDER — SENNOSIDES-DOCUSATE SODIUM 8.6-50 MG PO TABS
2.0000 | ORAL_TABLET | Freq: Every day | ORAL | Status: DC
  Administered 2021-12-05: 2 via ORAL
  Filled 2021-12-04: qty 2

## 2021-12-04 MED ORDER — OXYCODONE-ACETAMINOPHEN 5-325 MG PO TABS: 1.0000 | ORAL_TABLET | ORAL | Status: DC | PRN

## 2021-12-04 MED ORDER — LIDOCAINE HCL (PF) 1 % IJ SOLN: 30.0000 mL | INTRAMUSCULAR | Status: DC | PRN

## 2021-12-04 MED ORDER — BENZOCAINE-MENTHOL 20-0.5 % EX AERO
1.0000 "application " | INHALATION_SPRAY | CUTANEOUS | Status: DC | PRN
  Administered 2021-12-04: 1 via TOPICAL
  Filled 2021-12-04: qty 56

## 2021-12-04 MED ORDER — TETANUS-DIPHTH-ACELL PERTUSSIS 5-2.5-18.5 LF-MCG/0.5 IM SUSY: 0.5000 mL | PREFILLED_SYRINGE | Freq: Once | INTRAMUSCULAR | Status: DC

## 2021-12-04 MED ORDER — LIDOCAINE HCL (PF) 1 % IJ SOLN
INTRAMUSCULAR | Status: DC | PRN
  Administered 2021-12-04: 5 mL via EPIDURAL
  Administered 2021-12-04: 4 mL via EPIDURAL

## 2021-12-04 MED ORDER — SOD CITRATE-CITRIC ACID 500-334 MG/5ML PO SOLN: 30.0000 mL | ORAL | Status: DC | PRN
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
## 2021-12-04 NOTE — Anesthesia Preprocedure Evaluation (Signed)
Anesthesia Evaluation  Patient identified by MRN, date of birth, ID band Patient awake    Reviewed: Allergy & Precautions, NPO status , Patient's Chart, lab work & pertinent test results  History of Anesthesia Complications Negative for: history of anesthetic complications  Airway Mallampati: II   Neck ROM: Full    Dental   Pulmonary former smoker,    Pulmonary exam normal        Cardiovascular negative cardio ROS Normal cardiovascular exam     Neuro/Psych  Headaches, negative psych ROS   GI/Hepatic negative GI ROS, Neg liver ROS,   Endo/Other   Obesity   Renal/GU negative Renal ROS     Musculoskeletal negative musculoskeletal ROS (+)   Abdominal   Peds  Hematology  (+) Blood dyscrasia, anemia ,   Anesthesia Other Findings   Reproductive/Obstetrics (+) Pregnancy                             Anesthesia Physical Anesthesia Plan  ASA: 2  Anesthesia Plan: Epidural   Post-op Pain Management:    Induction:   PONV Risk Score and Plan: 2 and Treatment may vary due to age or medical condition  Airway Management Planned: Natural Airway  Additional Equipment: None  Intra-op Plan:   Post-operative Plan:   Informed Consent: I have reviewed the patients History and Physical, chart, labs and discussed the procedure including the risks, benefits and alternatives for the proposed anesthesia with the patient or authorized representative who has indicated his/her understanding and acceptance.       Plan Discussed with: Anesthesiologist  Anesthesia Plan Comments: (Labs reviewed. Platelets acceptable, patient not taking any blood thinning medications. Per RN, FHR tracing reported to be stable enough for sitting procedure. Risks and benefits discussed with patient, including PDPH, backache, epidural hematoma, failed epidural, blood pressure changes, allergic reaction, and nerve injury.  Patient expressed understanding and wished to proceed.)        Anesthesia Quick Evaluation

## 2021-12-04 NOTE — MAU Note (Signed)
.  [name] is a 36 y.o. at [redacted]w[redacted]d here in MAU reporting irregular ctxs since last night. Ctxs have been stronger since 0330. Some slight bloody show. Denies LOF. Reports good FM.   Onset of complaint: last night Pain score: 10 Vitals:   12/04/21 0541 12/04/21 0542  BP:  (!) 141/74  Pulse: 93   Resp: 18   Temp: 98.7 F (37.1 C)   SpO2: 100%      FHT:151 Lab orders placed from triage:u/a

## 2021-12-04 NOTE — Lactation Note (Signed)
This note was copied from a baby's chart. Lactation Consultation Note  Patient Name: [name] IWLNL'G Date: 12/04/2021 Reason for consult: Initial assessment;Term Age:35 hours   P3 mother whose infant is now 28 hours old.  This is a term baby at 39+6 weeks.  Mother's current feeding preference is breast.  Baby "[name]" was beginning to arouse when I arrived.  Offered to assist with waking and latching; mother receptive.  Taught hand expression and finger fed colostrum drops.  Attempted to latch, however, "[name]" remained too sleepy to arouse and latch.  Reassurance given.  Placed her STS and she fell asleep on mother's chest.    Reviewed breast feeding basics.  Encouraged mother to call her RN/LC for latch assistance as needed.  No support person present at this time.     Maternal Data Has patient been taught Hand Expression?: Yes Does the patient have breastfeeding experience prior to this delivery?: Yes How long did the patient breastfeed?: 3 months with her first child and one month with her second child; youngest child is 21 years old  Feeding Mother's Current Feeding Choice: Breast Milk  LATCH Score Latch: Too sleepy or reluctant, no latch achieved, no sucking elicited.  Audible Swallowing: None  Type of Nipple: Everted at rest and after stimulation  Comfort (Breast/Nipple): Soft / non-tender  Hold (Positioning): Assistance needed to correctly position infant at breast and maintain latch.  LATCH Score: 5   Lactation Tools Discussed/Used    Interventions Interventions: Breast feeding basics reviewed;Assisted with latch;Skin to skin;Breast massage;Hand express;Breast compression;Position options;Support pillows;Adjust position;Education;LC Services brochure  Discharge Pump: Personal WIC Program: No  Consult Status Consult Status: Follow-up Date: 12/05/21 Follow-up type: In-patient    [name] 12/04/2021, 1:26 PM

## 2021-12-04 NOTE — Progress Notes (Signed)
FHR 155 when EFM d/c for transfer via stretcher to BS. Pt remained R lateral for transfer

## 2021-12-04 NOTE — Anesthesia Procedure Notes (Signed)
Epidural Patient location during procedure: OB Start time: 12/04/2021 6:44 AM End time: 12/04/2021 6:48 AM  Staffing Anesthesiologist: Beryle Lathe, MD Performed: anesthesiologist   Preanesthetic Checklist Completed: patient identified, IV checked, risks and benefits discussed, monitors and equipment checked, pre-op evaluation and timeout performed  Epidural Patient position: sitting Prep: DuraPrep Patient monitoring: continuous pulse ox and blood pressure Approach: midline Location: L2-L3 Injection technique: LOR saline  Needle:  Needle type: Tuohy  Needle gauge: 17 G Needle length: 9 cm Needle insertion depth: 6 cm Catheter size: 19 Gauge Catheter at skin depth: 11 cm Test dose: negative and Other (1% lidocaine)  Assessment Events: blood not aspirated  Additional Notes Patient identified. Risks including, but not limited to, bleeding, infection, nerve damage, paralysis, inadequate analgesia, blood pressure changes, nausea, vomiting, allergic reaction, postpartum back pain, itching, and headache were discussed. Patient expressed understanding and wished to proceed. Sterile prep and drape, including hand hygiene, mask, and sterile gloves were used. The patient was positioned and the spine was prepped. The skin was anesthetized with lidocaine. No paraesthesia or other complication noted. The patient did not experience any signs of intravascular injection such as tinnitus or metallic taste in mouth, nor signs of intrathecal spread such as rapid motor block. Please see nursing notes for vital signs. The patient tolerated the procedure well.   Leslye Peer, MDReason for block:procedure for pain

## 2021-12-05 LAB — CBC
HCT: 29.6 % — ABNORMAL LOW (ref 36.0–46.0)
Hemoglobin: 9.9 g/dL — ABNORMAL LOW (ref 12.0–15.0)
MCH: 30.6 pg (ref 26.0–34.0)
MCHC: 33.4 g/dL (ref 30.0–36.0)
MCV: 91.4 fL (ref 80.0–100.0)
Platelets: 212 10*3/uL (ref 150–400)
RBC: 3.24 MIL/uL — ABNORMAL LOW (ref 3.87–5.11)
RDW: 16.3 % — ABNORMAL HIGH (ref 11.5–15.5)
WBC: 15.8 10*3/uL — ABNORMAL HIGH (ref 4.0–10.5)
nRBC: 0 % (ref 0.0–0.2)

## 2021-12-05 MED ORDER — IBUPROFEN 600 MG PO TABS
600.0000 mg | ORAL_TABLET | Freq: Four times a day (QID) | ORAL | 0 refills | Status: AC | PRN
Start: 2021-12-05 — End: ?

## 2021-12-05 MED ORDER — PRENATAL MULTIVITAMIN CH: 1.0000 | ORAL_TABLET | Freq: Every day | ORAL | 1 refills | Status: AC

## 2021-12-05 NOTE — Anesthesia Postprocedure Evaluation (Signed)
Anesthesia Post Note  Patient: [name]  Procedure(s) Performed: AN AD HOC LABOR EPIDURAL     Patient location during evaluation: Mother Baby Anesthesia Type: Epidural Level of consciousness: awake and alert Pain management: pain level controlled Vital Signs Assessment: post-procedure vital signs reviewed and stable Respiratory status: spontaneous breathing, nonlabored ventilation and respiratory function stable Cardiovascular status: stable Postop Assessment: no headache, no backache, epidural receding, no apparent nausea or vomiting, patient able to bend at knees, adequate PO intake and able to ambulate Anesthetic complications: no   No notable events documented.  Last Vitals:  Vitals:   12/05/21 0005 12/05/21 0525  BP: 119/75 111/66  Pulse: 78 68  Resp: 17 16  Temp: 37 C 36.5 C  SpO2: 99% 100%    Last Pain:  Vitals:   12/05/21 0814  TempSrc:   PainSc: 0-No pain   Pain Goal: Patients Stated Pain Goal: 0 (12/04/21 0545)                 Laban Emperor

## 2021-12-05 NOTE — Lactation Note (Signed)
This note was copied from a baby's chart. Lactation Consultation Note  Patient Name: Girl Brezlyn Troupe [id number] Date: 12/05/2021 Reason for consult: Follow-up assessment Age:34 hours   P3 mother whose infant is now 34 hours old.  This is a term baby at 34+6 weeks.  Mother's current feeding preference is breast.  Mother reported that "Chloe" has been latching/feeding better now; duration of 10-20 minutes/feeding.  Mother has learned how to latch deeply and denies pain with latching.  "Chloe" has been content after feedings; she may start cluster feeding today/tonight.   Reviewed feeding plan for after discharge.  Provided a manual pump with instructions for use.  #24 flange size is appropriate at this time.  Also gave mother coconut oil for comfort.  Mother has our OP phone number for any questions/concerns after discharge.  Informed her of the OP services available as needed.  Father and visitors present.   Maternal Data    Feeding    LATCH Score                    Lactation Tools Discussed/Used Tools: Pump Breast pump type: Manual Pump Education: Setup, frequency, and cleaning;Milk Storage Reason for Pumping: PRN; home use Pumping frequency: PRN  Interventions Interventions: Breast feeding basics reviewed;Education  Discharge Discharge Education: Engorgement and breast care Pump: Manual;Personal  Consult Status Consult Status: Complete Date: 12/05/21 Follow-up type: Call as needed    Naysha Sholl R Ladashia Demarinis 12/05/2021, 10:19 AM

## 2021-12-05 NOTE — Discharge Summary (Signed)
Postpartum Discharge Summary      Patient Name: [name] DOB: September 02, 1985 MRN: [id number]  Date of admission: 12/04/2021 Delivery date:12/04/2021  Delivering provider: Louretta Shorten  Date of discharge: 12/05/2021  Admitting diagnosis: Normal labor [O80, Z37.9] Intrauterine pregnancy: [redacted]w[redacted]d    Secondary diagnosis:  Principal Problem:   Normal labor  Additional problems:      Discharge diagnosis: Term Pregnancy Delivered                                              Post partum procedures:   Augmentation: AROM Complications: None  Hospital course: Onset of Labor With Vaginal Delivery      36y.o. yo [id number] admitted in Active Labor on 12/04/2021. Patient had an uncomplicated labor course as follows:  Membrane Rupture Time/Date: 8:34 AM ,12/04/2021   Delivery Method:Vaginal, Spontaneous  Episiotomy: None  Lacerations:  None  Patient had an uncomplicated postpartum course.  She is ambulating, tolerating a regular diet, passing flatus, and urinating well. Patient is discharged home in stable condition on 12/05/21.  Newborn Data: Birth date:12/04/2021  Birth time:9:32 AM  Gender:Female  Living status:Living  Apgars:9 ,9  Weight:3230 g   Magnesium Sulfate received: No BMZ received: No Rhophylac:N/A MMR:N/A T-DaP:Given prenatally Flu: N/A Transfusion:No  Physical exam  Vitals:   12/04/21 1614 12/04/21 2030 12/05/21 0005 12/05/21 0525  BP: 139/76 135/65 119/75 111/66  Pulse: 82 75 78 68  Resp: _0 Temp: 98.4 F (36.9 C) 98.4 F (36.9 C) 98.6 F (37 C) 97.7 F (36.5 C)  TempSrc: Oral Oral Oral Oral  SpO2: 100% 100% 99% 100%  Weight:      Height:       General: alert, cooperative, and no distress Lochia: appropriate Uterine Fundus: firm Incision: N/A DVT Evaluation: No evidence of DVT seen on physical exam. Labs: Lab Results  Component Value Date   WBC 15.8 (H) 12/05/2021   HGB 9.9 (L) 12/05/2021   HCT 29.6 (L) 12/05/2021    MCV 91.4 12/05/2021   PLT 212 12/05/2021      Latest Ref Rng & Units 02/12/2017    4:24 PM  CMP  Glucose 65 - 99 mg/dL 99   BUN 6 - 20 mg/dL 8   Creatinine 0.44 - 1.00 mg/dL 0.57   Sodium 135 - 145 mmol/L 137   Potassium 3.5 - 5.1 mmol/L 3.7   Chloride 101 - 111 mmol/L 103   CO2 22 - 32 mmol/L 26   Calcium 8.9 - 10.3 mg/dL 9.1   Total Protein 6.5 - 8.1 g/dL 6.7   Total Bilirubin 0.3 - 1.2 mg/dL 0.2   Alkaline Phos 38 - 126 U/L 47   AST 15 - 41 U/L 16   ALT 14 - 54 U/L 13    Edinburgh Score:     No data to display            After visit meds:  Allergies as of 12/05/2021   No Known Allergies      Medication List     TAKE these medications    ibuprofen 600 MG tablet Commonly known as: ADVIL Take 1 tablet (600 mg total) by mouth every 6 (six) hours as needed for cramping or mild pain.   prenatal multivitamin Tabs tablet Take 1 tablet by mouth daily at  12 noon.         Discharge home in stable condition Infant Feeding: Breast Infant Disposition:home with mother Discharge instruction: per After Visit Summary and Postpartum booklet. Activity: Advance as tolerated. Pelvic rest for 6 weeks.  Diet: routine diet Anticipated Birth Control: IUD Postpartum Appointment:6 weeks Additional Postpartum F/U:    Future Appointments:No future appointments. Follow up Visit:      12/05/2021 Luz Lex, MD

## 2021-12-07 ENCOUNTER — Inpatient Hospital Stay (HOSPITAL_COMMUNITY)
Admission: AD | Admit: 2021-12-07 | Payer: Federal, State, Local not specified - PPO | Source: Home / Self Care | Admitting: Obstetrics and Gynecology

## 2021-12-07 ENCOUNTER — Inpatient Hospital Stay (HOSPITAL_COMMUNITY): Payer: Federal, State, Local not specified - PPO

## 2021-12-13 ENCOUNTER — Telehealth (HOSPITAL_COMMUNITY): Payer: Self-pay

## 2021-12-13 NOTE — Telephone Encounter (Signed)
"  We are doing great. Baby is doing great as well." Patient declines questions or concerns about herself or baby. "Baby sleeps in a crib." Phone line disconnected. Attempted to call again x 2 and voicemail left.   EPDS- not completed at this time. Phone line disconnected.   [name] [facility] 06/19//2023,1821

## 2021-12-13 NOTE — Telephone Encounter (Signed)
Opened in error.   [name] [facility] 06/19//2023,1807

## 2022-01-20 DIAGNOSIS — Z1389 Encounter for screening for other disorder: Secondary | ICD-10-CM | POA: Diagnosis not present

## 2022-03-03 ENCOUNTER — Encounter (HOSPITAL_BASED_OUTPATIENT_CLINIC_OR_DEPARTMENT_OTHER): Payer: Self-pay | Admitting: Obstetrics and Gynecology

## 2022-03-03 NOTE — Progress Notes (Signed)
Spoke w/ via phone for pre-op interview--- [name] needs dos----   UPT. Surgeon orders pending.            Lab results------ COVID test -----patient states asymptomatic no test needed Arrive at -------0530 NPO after MN NO Solid Food.   Med rec completed Medications to take morning of surgery -----NONE Diabetic medication ----- Patient instructed no nail polish to be worn day of surgery Patient instructed to bring photo id and insurance card day of surgery Patient aware to have Driver (ride ) / caregiver  [name]  for 24 hours after surgery  Patient Special Instructions ----- Pre-Op special Istructions ----- Patient verbalized understanding of instructions that were given at this phone interview. Patient denies shortness of breath, chest pain, fever, cough at this phone interview.

## 2022-03-07 DIAGNOSIS — F419 Anxiety disorder, unspecified: Secondary | ICD-10-CM | POA: Diagnosis not present
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
## 2022-03-21 ENCOUNTER — Encounter (HOSPITAL_BASED_OUTPATIENT_CLINIC_OR_DEPARTMENT_OTHER)
Admission: RE | Disposition: A | Discharge status: Home / Self Care | Payer: Self-pay | Source: Home / Self Care | Attending: Obstetrics and Gynecology

## 2022-03-21 ENCOUNTER — Ambulatory Visit (HOSPITAL_BASED_OUTPATIENT_CLINIC_OR_DEPARTMENT_OTHER): Payer: Federal, State, Local not specified - PPO | Admitting: Anesthesiology

## 2022-03-21 ENCOUNTER — Encounter (HOSPITAL_BASED_OUTPATIENT_CLINIC_OR_DEPARTMENT_OTHER): Payer: Self-pay | Admitting: Obstetrics and Gynecology

## 2022-03-21 ENCOUNTER — Other Ambulatory Visit: Payer: Self-pay

## 2022-03-21 ENCOUNTER — Ambulatory Visit (HOSPITAL_BASED_OUTPATIENT_CLINIC_OR_DEPARTMENT_OTHER)
Admission: RE | Admit: 2022-03-21 | Discharge: 2022-03-21 | Disposition: A | Discharge status: Home / Self Care | Payer: Federal, State, Local not specified - PPO | Attending: Obstetrics and Gynecology | Admitting: Obstetrics and Gynecology

## 2022-03-21 DIAGNOSIS — Z87891 Personal history of nicotine dependence: Secondary | ICD-10-CM | POA: Insufficient documentation

## 2022-03-21 DIAGNOSIS — R519 Headache, unspecified: Secondary | ICD-10-CM | POA: Diagnosis not present

## 2022-03-21 DIAGNOSIS — F419 Anxiety disorder, unspecified: Secondary | ICD-10-CM | POA: Insufficient documentation

## 2022-03-21 DIAGNOSIS — Z302 Encounter for sterilization: Secondary | ICD-10-CM | POA: Insufficient documentation

## 2022-03-21 DIAGNOSIS — Z3009 Encounter for other general counseling and advice on contraception: Secondary | ICD-10-CM

## 2022-03-21 DIAGNOSIS — Z01818 Encounter for other preprocedural examination: Secondary | ICD-10-CM

## 2022-03-21 HISTORY — PX: LAPAROSCOPIC TUBAL LIGATION: SHX1937

## 2022-03-21 LAB — TYPE AND SCREEN
ABO/RH(D): O POS
Antibody Screen: NEGATIVE

## 2022-03-21 LAB — POCT PREGNANCY, URINE: Preg Test, Ur: NEGATIVE

## 2022-03-21 SURGERY — LIGATION, FALLOPIAN TUBE, LAPAROSCOPIC
Anesthesia: General | Site: Abdomen | Laterality: Bilateral

## 2022-03-21 MED ORDER — ONDANSETRON HCL 4 MG/2ML IJ SOLN
INTRAMUSCULAR | Status: DC | PRN
  Administered 2022-03-21: 4 mg via INTRAVENOUS

## 2022-03-21 MED ORDER — DEXAMETHASONE SODIUM PHOSPHATE 4 MG/ML IJ SOLN
INTRAMUSCULAR | Status: DC | PRN
  Administered 2022-03-21: 10 mg via INTRAVENOUS

## 2022-03-21 MED ORDER — PROPOFOL 10 MG/ML IV BOLUS
INTRAVENOUS | Status: DC | PRN
  Administered 2022-03-21: 200 mg via INTRAVENOUS

## 2022-03-21 MED ORDER — SUGAMMADEX SODIUM 200 MG/2ML IV SOLN
INTRAVENOUS | Status: DC | PRN
  Administered 2022-03-21: 360 mg via INTRAVENOUS

## 2022-03-21 MED ORDER — OXYCODONE HCL 5 MG PO TABS: 5.0000 mg | ORAL_TABLET | Freq: Once | ORAL | Status: DC | PRN

## 2022-03-21 MED ORDER — FENTANYL CITRATE (PF) 100 MCG/2ML IJ SOLN: 25.0000 ug | INTRAMUSCULAR | Status: DC | PRN

## 2022-03-21 MED ORDER — FENTANYL CITRATE (PF) 100 MCG/2ML IJ SOLN
INTRAMUSCULAR | Status: AC
  Filled 2022-03-21: qty 2

## 2022-03-21 MED ORDER — KETOROLAC TROMETHAMINE 30 MG/ML IJ SOLN
INTRAMUSCULAR | Status: DC | PRN
  Administered 2022-03-21: 30 mg via INTRAVENOUS

## 2022-03-21 MED ORDER — MIDAZOLAM HCL 2 MG/2ML IJ SOLN
INTRAMUSCULAR | Status: AC
  Filled 2022-03-21: qty 2

## 2022-03-21 MED ORDER — LACTATED RINGERS IV SOLN: INTRAVENOUS | Status: DC

## 2022-03-21 MED ORDER — ACETAMINOPHEN 500 MG PO TABS
ORAL_TABLET | ORAL | Status: AC
  Filled 2022-03-21: qty 2

## 2022-03-21 MED ORDER — SODIUM CHLORIDE 0.9 % IV SOLN
2.0000 g | INTRAVENOUS | Status: AC
  Administered 2022-03-21: 2 g via INTRAVENOUS

## 2022-03-21 MED ORDER — SCOPOLAMINE 1 MG/3DAYS TD PT72
MEDICATED_PATCH | TRANSDERMAL | Status: AC
  Filled 2022-03-21: qty 1

## 2022-03-21 MED ORDER — BUPIVACAINE HCL (PF) 0.25 % IJ SOLN
INTRAMUSCULAR | Status: DC | PRN
  Administered 2022-03-21: 20 mL

## 2022-03-21 MED ORDER — SCOPOLAMINE 1 MG/3DAYS TD PT72
1.0000 | MEDICATED_PATCH | TRANSDERMAL | Status: DC
  Administered 2022-03-21: 1.5 mg via TRANSDERMAL

## 2022-03-21 MED ORDER — ACETAMINOPHEN 500 MG PO TABS
1000.0000 mg | ORAL_TABLET | Freq: Once | ORAL | Status: AC
  Administered 2022-03-21: 1000 mg via ORAL

## 2022-03-21 MED ORDER — ROCURONIUM BROMIDE 100 MG/10ML IV SOLN
INTRAVENOUS | Status: DC | PRN
  Administered 2022-03-21: 50 mg via INTRAVENOUS

## 2022-03-21 MED ORDER — LIDOCAINE HCL (CARDIAC) PF 100 MG/5ML IV SOSY
PREFILLED_SYRINGE | INTRAVENOUS | Status: DC | PRN
  Administered 2022-03-21: 50 mg via INTRAVENOUS

## 2022-03-21 MED ORDER — FENTANYL CITRATE (PF) 100 MCG/2ML IJ SOLN
INTRAMUSCULAR | Status: DC | PRN
  Administered 2022-03-21 (×2): 50 ug via INTRAVENOUS

## 2022-03-21 MED ORDER — 0.9 % SODIUM CHLORIDE (POUR BTL) OPTIME
TOPICAL | Status: DC | PRN
  Administered 2022-03-21: 500 mL

## 2022-03-21 MED ORDER — POVIDONE-IODINE 10 % EX SWAB: 2.0000 | Freq: Once | CUTANEOUS | Status: DC

## 2022-03-21 MED ORDER — AMISULPRIDE (ANTIEMETIC) 5 MG/2ML IV SOLN: 10.0000 mg | Freq: Once | INTRAVENOUS | Status: DC | PRN

## 2022-03-21 MED ORDER — MIDAZOLAM HCL 2 MG/2ML IJ SOLN
INTRAMUSCULAR | Status: DC | PRN
  Administered 2022-03-21: 2 mg via INTRAVENOUS

## 2022-03-21 MED ORDER — SODIUM CHLORIDE 0.9 % IV SOLN
INTRAVENOUS | Status: AC
  Filled 2022-03-21: qty 2

## 2022-03-21 MED ORDER — OXYCODONE HCL 5 MG/5ML PO SOLN: 5.0000 mg | Freq: Once | ORAL | Status: DC | PRN

## 2022-03-21 MED ORDER — OXYCODONE-ACETAMINOPHEN 5-325 MG PO TABS: 1.0000 | ORAL_TABLET | ORAL | 0 refills | Status: AC | PRN

## 2022-03-21 SURGICAL SUPPLY — 26 items
ADH SKN CLS APL DERMABOND .7 (GAUZE/BANDAGES/DRESSINGS) ×1
CLIP FILSHIE TUBAL LIGA STRL (Clip) IMPLANT
COVER MAYO STAND STRL (DRAPES) ×1 IMPLANT
DERMABOND ADVANCED .7 DNX12 (GAUZE/BANDAGES/DRESSINGS) ×1 IMPLANT
DURAPREP 26ML APPLICATOR (WOUND CARE) ×1 IMPLANT
GLOVE BIO SURGEON STRL SZ7 (GLOVE) ×2 IMPLANT
GLOVE BIOGEL PI IND STRL 6.5 (GLOVE) IMPLANT
GLOVE SURG SS PI 6.5 STRL IVOR (GLOVE) IMPLANT
GLOVE SURG SS PI 7.0 STRL IVOR (GLOVE) IMPLANT
GOWN STRL REUS W/TWL LRG LVL3 (GOWN DISPOSABLE) ×2 IMPLANT
KIT TURNOVER CYSTO (KITS) ×1 IMPLANT
NDL INSUFFLATION 14GA 120MM (NEEDLE) ×1 IMPLANT
NEEDLE INSUFFLATION 14GA 120MM (NEEDLE) ×1 IMPLANT
NS IRRIG 500ML POUR BTL (IV SOLUTION) ×1 IMPLANT
PACK LAPAROSCOPY BASIN (CUSTOM PROCEDURE TRAY) ×1 IMPLANT
PACK TRENDGUARD 450 HYBRID PRO (MISCELLANEOUS) IMPLANT
PAD OB MATERNITY 4.3X12.25 (PERSONAL CARE ITEMS) ×1 IMPLANT
PAD PREP 24X48 CUFFED NSTRL (MISCELLANEOUS) ×1 IMPLANT
SET TUBE SMOKE EVAC HIGH FLOW (TUBING) ×1 IMPLANT
SOL PREP POV-IOD 4OZ 10% (MISCELLANEOUS) IMPLANT
SUT MNCRL AB 4-0 PS2 18 (SUTURE) ×1 IMPLANT
SUT VICRYL 0 UR6 27IN ABS (SUTURE) IMPLANT
TOWEL OR 17X26 10 PK STRL BLUE (TOWEL DISPOSABLE) ×2 IMPLANT
TRENDGUARD 450 HYBRID PRO PACK (MISCELLANEOUS) ×1
TROCAR Z-THREAD BLADED 11X100M (TROCAR) ×1 IMPLANT
WARMER LAPAROSCOPE (MISCELLANEOUS) ×1 IMPLANT

## 2022-03-21 NOTE — Anesthesia Preprocedure Evaluation (Addendum)
Anesthesia Evaluation  Patient identified by MRN, date of birth, ID band Patient awake    Reviewed: Allergy & Precautions, NPO status , Patient's Chart, lab work & pertinent test results  Airway Mallampati: II  TM Distance: >3 FB Neck ROM: Full    Dental   Pulmonary former smoker,    breath sounds clear to auscultation       Cardiovascular negative cardio ROS   Rhythm:Regular Rate:Normal     Neuro/Psych  Headaches,    GI/Hepatic negative GI ROS, Neg liver ROS,   Endo/Other  negative endocrine ROS  Renal/GU negative Renal ROS     Musculoskeletal   Abdominal   Peds  Hematology negative hematology ROS (+)   Anesthesia Other Findings   Reproductive/Obstetrics                             Lab Results  Component Value Date   WBC 15.8 (H) 12/05/2021   HGB 9.9 (L) 12/05/2021   HCT 29.6 (L) 12/05/2021   MCV 91.4 12/05/2021   PLT 212 12/05/2021   Lab Results  Component Value Date   CREATININE 0.57 02/12/2017   BUN 8 02/12/2017   NA 137 02/12/2017   K 3.7 02/12/2017   CL 103 02/12/2017   CO2 26 02/12/2017    Anesthesia Physical Anesthesia Plan  ASA: 1  Anesthesia Plan: General   Post-op Pain Management: Tylenol PO (pre-op)* and Toradol IV (intra-op)*   Induction: Intravenous  PONV Risk Score and Plan: 4 or greater and Midazolam, Dexamethasone, Ondansetron, Treatment may vary due to age or medical condition and Scopolamine patch - Pre-op  Airway Management Planned: Oral ETT  Additional Equipment: None  Intra-op Plan:   Post-operative Plan: Extubation in OR  Informed Consent: I have reviewed the patients History and Physical, chart, labs and discussed the procedure including the risks, benefits and alternatives for the proposed anesthesia with the patient or authorized representative who has indicated his/her understanding and acceptance.     Dental advisory given  Plan  Discussed with: CRNA  Anesthesia Plan Comments:        Anesthesia Quick Evaluation

## 2022-03-21 NOTE — Discharge Instructions (Signed)

## 2022-03-21 NOTE — Transfer of Care (Signed)
Immediate Anesthesia Transfer of Care Note  Patient: [name]  Procedure(s) Performed: LAPAROSCOPIC BILATERAL TUBAL LIGATION WITH FILSHIE CLIPS (Bilateral: Abdomen)  Patient Location: PACU  Anesthesia Type:General  Level of Consciousness: awake and patient cooperative  Airway & Oxygen Therapy: Patient Spontanous Breathing and Patient connected to nasal cannula oxygen  Post-op Assessment: Report given to RN and Post -op Vital signs reviewed and stable  Post vital signs: Reviewed and stable  Last Vitals:  Vitals Value Taken Time  BP 125/76 03/21/22 0813  Temp 36.7 C 03/21/22 0813  Pulse 73 03/21/22 0814  Resp 24 03/21/22 0814  SpO2 100 % 03/21/22 0814  Vitals shown include unvalidated device data.  Last Pain:  Vitals:   03/21/22 0604  TempSrc: Oral  PainSc: 0-No pain      Patients Stated Pain Goal: 5 (70/48/88 9169)  Complications: No notable events documented.

## 2022-03-21 NOTE — Anesthesia Postprocedure Evaluation (Signed)
Anesthesia Post Note  Patient: [name]  Procedure(s) Performed: LAPAROSCOPIC BILATERAL TUBAL LIGATION WITH FILSHIE CLIPS (Bilateral: Abdomen)     Patient location during evaluation: PACU Anesthesia Type: General Level of consciousness: awake and alert Pain management: pain level controlled Vital Signs Assessment: post-procedure vital signs reviewed and stable Respiratory status: spontaneous breathing, nonlabored ventilation, respiratory function stable and patient connected to nasal cannula oxygen Cardiovascular status: blood pressure returned to baseline and stable Postop Assessment: no apparent nausea or vomiting Anesthetic complications: no   No notable events documented.  Last Vitals:  Vitals:   03/21/22 0916 03/21/22 0954  BP:  120/75  Pulse: 61 64  Resp: 17 16  Temp: 36.9 C 36.6 C  SpO2: 98% 100%    Last Pain:  Vitals:   03/21/22 0954  TempSrc:   PainSc: 5                  Tiajuana Amass

## 2022-03-21 NOTE — Anesthesia Procedure Notes (Signed)
Procedure Name: Intubation Date/Time: 03/21/2022 7:32 AM  Performed by: Georgeanne Nim, CRNAPre-anesthesia Checklist: Patient identified, Emergency Drugs available, Suction available, Timeout performed and Patient being monitored Patient Re-evaluated:Patient Re-evaluated prior to induction Oxygen Delivery Method: Circle system utilized Preoxygenation: Pre-oxygenation with 100% oxygen Induction Type: IV induction Ventilation: Mask ventilation without difficulty Laryngoscope Size: Mac and 4 Grade View: Grade I Tube type: Oral Tube size: 7.0 mm Number of attempts: 1 Airway Equipment and Method: Stylet Placement Confirmation: ETT inserted through vocal cords under direct vision, positive ETCO2, CO2 detector and breath sounds checked- equal and bilateral Secured at: 22 cm Tube secured with: Tape Dental Injury: Teeth and Oropharynx as per pre-operative assessment

## 2022-03-21 NOTE — Op Note (Signed)
Preoperative diagnosis: Request permanent sterilization  Postoperative diagnosis: Same  Procedure: Laparoscopy, with application of Filshie clips for sterilization.  Surgeon: Matthew Saras  EBL: Less than 10 cc  Procedure and findings: Therapy patient taken the operating room after an adequate level of general anesthesia was obtained the patient's legs in stirrups the abdomen perineum and vagina were prepped and draped she had voided just before coming back to the OR.  Appropriate timeouts were taken she was then prepped and draped, Hulka tenaculum was position for a mid position uterus, her EUA was otherwise normal.  Attention directed to the abdomen, the subumbilical area was infiltrated with quarter percent Marcaine plain small incision was made and the varies needle was introduced to have difficulty.  This intra-abdominal position was verified by pressure and motor testing.  After 3 L pneumoperitoneum was then created, laparoscopic trocar sleeve was then introduced that difficulty.  There was no evidence of any bleeding or trauma.  Patient was placed in Trendelenburg pelvic findings as follows:  Anterior posterior cul-de-sac space is free and clear bilateral tubes and ovaries unremarkable the upper abdomen normal status.  Starting on the right, half percent Marcaine plain 45 cc were then draped across the tube from the cornu to the colon.  Then this was repeated on the opposite side.  Filshie clip applicator was backloaded right tube was traced to the fimbriated end, regressed several centimeters from the cornu clip applied to the right ankle with excellent application.  The exact same repeated on the left side after carefully identifying to.  Application was then photo documented.  Instruments were removed gas allowed to escape, 4-0 Monocryl subcuticular skin closure with Dermabond.  She tolerated this well went to recovery room in good condition.  Dictated with Dragon [employer]  Nira Retort MD

## 2022-03-21 NOTE — Progress Notes (Signed)
The patient was re-examined with no change in status 

## 2022-03-22 ENCOUNTER — Encounter (HOSPITAL_BASED_OUTPATIENT_CLINIC_OR_DEPARTMENT_OTHER): Payer: Self-pay | Admitting: Obstetrics and Gynecology
